# Patient Record
Sex: Female | Born: 1965
Health system: Southern US, Community
[De-identification: ages and names within clinical notes are randomized; demographics above are authoritative.]

## PROBLEM LIST (undated history)

## (undated) DIAGNOSIS — F419 Anxiety disorder, unspecified: Secondary | ICD-10-CM

## (undated) DIAGNOSIS — E559 Vitamin D deficiency, unspecified: Secondary | ICD-10-CM

## (undated) DIAGNOSIS — E785 Hyperlipidemia, unspecified: Secondary | ICD-10-CM

## (undated) HISTORY — DX: Anxiety disorder, unspecified: F41.9

## (undated) HISTORY — DX: Hyperlipidemia, unspecified: E78.5

---

## 1989-04-25 HISTORY — PX: ABDOMINAL HYSTERECTOMY: SHX81

## 1989-04-25 HISTORY — PX: RADICAL ABDOMINAL HYSTERECTOMY: SUR659

## 2002-12-16 ENCOUNTER — Emergency Department (HOSPITAL_COMMUNITY): Admission: EM | Admit: 2002-12-16 | Discharge: 2002-12-16 | Payer: Self-pay | Admitting: Emergency Medicine

## 2010-09-21 ENCOUNTER — Other Ambulatory Visit: Payer: Self-pay | Admitting: Family Medicine

## 2010-09-21 DIAGNOSIS — R928 Other abnormal and inconclusive findings on diagnostic imaging of breast: Secondary | ICD-10-CM

## 2010-09-21 DIAGNOSIS — N6459 Other signs and symptoms in breast: Secondary | ICD-10-CM

## 2010-10-06 ENCOUNTER — Encounter (HOSPITAL_COMMUNITY): Payer: Self-pay

## 2010-10-13 ENCOUNTER — Encounter (HOSPITAL_COMMUNITY): Payer: Self-pay

## 2010-10-13 ENCOUNTER — Other Ambulatory Visit (HOSPITAL_COMMUNITY): Payer: Self-pay | Admitting: Family Medicine

## 2010-10-13 ENCOUNTER — Ambulatory Visit (HOSPITAL_COMMUNITY)
Admission: RE | Admit: 2010-10-13 | Discharge: 2010-10-13 | Disposition: A | Discharge status: Home / Self Care | Payer: BC Managed Care – PPO | Source: Ambulatory Visit | Attending: Family Medicine | Admitting: Family Medicine

## 2010-10-13 DIAGNOSIS — R928 Other abnormal and inconclusive findings on diagnostic imaging of breast: Secondary | ICD-10-CM | POA: Insufficient documentation

## 2010-10-13 DIAGNOSIS — N6459 Other signs and symptoms in breast: Secondary | ICD-10-CM

## 2013-06-10 ENCOUNTER — Encounter: Payer: Self-pay | Admitting: *Deleted

## 2013-06-10 ENCOUNTER — Encounter (INDEPENDENT_AMBULATORY_CARE_PROVIDER_SITE_OTHER): Payer: Self-pay

## 2013-06-10 ENCOUNTER — Ambulatory Visit (INDEPENDENT_AMBULATORY_CARE_PROVIDER_SITE_OTHER): Payer: BC Managed Care – PPO | Admitting: Family Medicine

## 2013-06-10 ENCOUNTER — Telehealth: Payer: Self-pay | Admitting: Family Medicine

## 2013-06-10 ENCOUNTER — Encounter: Payer: Self-pay | Admitting: Family Medicine

## 2013-06-10 VITALS — BP 116/79 | HR 84 | Temp 97.2°F | Ht 64.0 in | Wt 170.6 lb

## 2013-06-10 DIAGNOSIS — J322 Chronic ethmoidal sinusitis: Secondary | ICD-10-CM

## 2013-06-10 MED ORDER — FLUCONAZOLE 150 MG PO TABS
150.0000 mg | ORAL_TABLET | Freq: Once | ORAL | Status: DC
Start: 2013-06-10 — End: 2014-05-12

## 2013-06-10 MED ORDER — AMOXICILLIN 875 MG PO TABS: 875.0000 mg | ORAL_TABLET | Freq: Two times a day (BID) | ORAL | Status: DC

## 2013-06-10 MED ORDER — ALPRAZOLAM 0.25 MG PO TABS: 0.2500 mg | ORAL_TABLET | Freq: Two times a day (BID) | ORAL | Status: DC | PRN

## 2013-06-10 NOTE — Telephone Encounter (Signed)
appt given  

## 2013-06-12 NOTE — Progress Notes (Signed)
   Subjective:    Patient ID: Lorraine BritainLisa Mcdaniel, female    DOB: 03-27-1966, 48 y.o.   MRN: [id number]  HPI  This 48 y.o. female presents for evaluation of URI sx's and sinus pressure.  Review of Systems    No chest pain, SOB, HA, dizziness, vision change, N/V, diarrhea, constipation, dysuria, urinary urgency or frequency, myalgias, arthralgias or rash.  Objective:   Physical Exam   Vital signs noted  Well developed well nourished female.  HEENT - Head atraumatic Normocephalic                Eyes - PERRLA, Conjuctiva - clear Sclera- Clear EOMI                Ears - EAC's Wnl TM's Wnl Gross Hearing WNL                Nose - Nares patent                 Throat - oropharanx wnl Respiratory - Lungs CTA bilateral Cardiac - RRR S1 and S2 without murmur GI - Abdomen soft Nontender and bowel sounds active x 4 Extremities - No edema. Neuro - Grossly intact.     Assessment & Plan:  Ethmoid sinusitis - Plan: amoxicillin (AMOXIL) 875 MG tablet Push po fluids, rest, tylenol and motrin otc prn as directed for fever, arthralgias, and myalgias.  Follow up prn if sx's continue or persist.  Deatra CanterWilliam J Lamari Beckles FNP

## 2014-05-12 ENCOUNTER — Encounter: Payer: Self-pay | Admitting: Family

## 2014-05-12 ENCOUNTER — Telehealth: Payer: Self-pay | Admitting: Family Medicine

## 2014-05-12 ENCOUNTER — Ambulatory Visit (INDEPENDENT_AMBULATORY_CARE_PROVIDER_SITE_OTHER): Payer: BLUE CROSS/BLUE SHIELD | Admitting: Family

## 2014-05-12 VITALS — BP 132/81 | HR 60 | Temp 98.0°F | Ht 64.0 in | Wt 173.6 lb

## 2014-05-12 DIAGNOSIS — R059 Cough, unspecified: Secondary | ICD-10-CM

## 2014-05-12 DIAGNOSIS — J069 Acute upper respiratory infection, unspecified: Secondary | ICD-10-CM

## 2014-05-12 DIAGNOSIS — R05 Cough: Secondary | ICD-10-CM

## 2014-05-12 MED ORDER — BENZONATATE 200 MG PO CAPS: 200.0000 mg | ORAL_CAPSULE | Freq: Three times a day (TID) | ORAL | Status: DC | PRN

## 2014-05-12 MED ORDER — ALPRAZOLAM 0.25 MG PO TABS: 0.2500 mg | ORAL_TABLET | Freq: Two times a day (BID) | ORAL | Status: DC | PRN

## 2014-05-12 MED ORDER — FLUCONAZOLE 150 MG PO TABS: 150.0000 mg | ORAL_TABLET | Freq: Once | ORAL | Status: DC

## 2014-05-12 NOTE — Progress Notes (Addendum)
Subjective:    Patient ID: Lorraine Mcdaniel, female    DOB: Nov 11, 1965, 49 y.o.   MRN: [id number]  Cough This is a new problem. The current episode started in the past 7 days. The problem has been unchanged. The problem occurs every few minutes. The cough is productive of purulent sputum and productive of sputum. Associated symptoms include headaches, myalgias, nasal congestion, postnasal drip and a sore throat. Pertinent negatives include no chills, ear congestion, ear pain, fever, rhinorrhea, shortness of breath or wheezing. The symptoms are aggravated by exercise. She has tried rest (Work NP prescribed antibiotic ) for the symptoms. The treatment provided mild relief. There is no history of asthma or COPD.   *Pt saw NP at work as was prescribed levaquin 750 mg dailyfor 10 days. Pt started on that yesterday.    Review of Systems  Constitutional: Negative.  Negative for fever and chills.  HENT: Positive for postnasal drip and sore throat. Negative for ear pain and rhinorrhea.   Eyes: Negative.   Respiratory: Positive for cough. Negative for shortness of breath and wheezing.   Cardiovascular: Negative.  Negative for palpitations.  Gastrointestinal: Negative.   Endocrine: Negative.   Genitourinary: Negative.   Musculoskeletal: Positive for myalgias.  Neurological: Positive for headaches.  Hematological: Negative.   Psychiatric/Behavioral: Negative.   All other systems reviewed and are negative.      Objective:   Physical Exam  Constitutional: She is oriented to person, place, and time. She appears well-developed and well-nourished. No distress.  HENT:  Head: Normocephalic and atraumatic.  Right Ear: External ear normal.  Left Ear: External ear normal.  Nasal passage erythemas with mild swelling Oropharynx erythemas   Eyes: Pupils are equal, round, and reactive to light.  Neck: Normal range of motion. Neck supple. No thyromegaly present.  Cardiovascular: Normal rate, regular  rhythm, normal heart sounds and intact distal pulses.   No murmur heard. Pulmonary/Chest: Effort normal and breath sounds normal. No respiratory distress. She has no wheezes.  Abdominal: Soft. Bowel sounds are normal. She exhibits no distension. There is no tenderness.  Musculoskeletal: Normal range of motion. She exhibits no edema or tenderness.  Neurological: She is alert and oriented to person, place, and time. She has normal reflexes. No cranial nerve deficit.  Skin: Skin is warm and dry.  Psychiatric: She has a normal mood and affect. Her behavior is normal. Judgment and thought content normal.  Vitals reviewed.     BP 132/81 mmHg  Pulse 60  Temp(Src) 98 F (36.7 C) (Oral)  Ht  (1.626 m)  Wt 173 lb 9.6 oz (78.744 kg)  BMI 29.78 kg/m2     Assessment & Plan:  1. Acute upper respiratory infection -- Take meds as prescribed - Use a cool mist humidifier  -Use saline nose sprays frequently -Saline irrigations of the nose can be very helpful if done frequently.  * 4X daily for 1 week*  * Use of a nettie pot can be helpful with this. Follow directions with this* -Force fluids -For any cough or congestion  Use plain Mucinex- regular strength or max strength is fine   * Children- consult with Pharmacist for dosing -For fever or aces or pains- take tylenol or ibuprofen appropriate for age and weight.  * for fevers greater than 101 orally you may alternate ibuprofen and tylenol every  3 hours. -Throat lozenges if help - benzonatate (TESSALON) 200 MG capsule; Take 1 capsule (200 mg total) by mouth 3 (three) times daily  as needed.  Dispense: 30 capsule; Refill: 1  2. Cough - benzonatate (TESSALON) 200 MG capsule; Take 1 capsule (200 mg total) by mouth 3 (three) times daily as needed.  Dispense: 30 capsule; Refill: 1  *Continue Levaquin    Lorraine Rodneyhristy Ozie Lupe, FNP

## 2014-05-12 NOTE — Patient Instructions (Addendum)
Upper Respiratory Infection, Adult An upper respiratory infection (URI) is also sometimes known as the common cold. The upper respiratory tract includes the nose, sinuses, throat, trachea, and bronchi. Bronchi are the airways leading to the lungs. Most people improve within 1 week, but symptoms can last up to 2 weeks. A residual cough may last even longer.  CAUSES Many different viruses can infect the tissues lining the upper respiratory tract. The tissues become irritated and inflamed and often become very moist. Mucus production is also common. A cold is contagious. You can easily spread the virus to others by oral contact. This includes kissing, sharing a glass, coughing, or sneezing. Touching your mouth or nose and then touching a surface, which is then touched by another person, can also spread the virus. SYMPTOMS  Symptoms typically develop 1 to 3 days after you come in contact with a cold virus. Symptoms vary from person to person. They may include:  Runny nose.  Sneezing.  Nasal congestion.  Sinus irritation.  Sore throat.  Loss of voice (laryngitis).  Cough.  Fatigue.  Muscle aches.  Loss of appetite.  Headache.  Low-grade fever. DIAGNOSIS  You might diagnose your own cold based on familiar symptoms, since most people get a cold 2 to 3 times a year. Your caregiver can confirm this based on your exam. Most importantly, your caregiver can check that your symptoms are not due to another disease such as strep throat, sinusitis, pneumonia, asthma, or epiglottitis. Blood tests, throat tests, and X-rays are not necessary to diagnose a common cold, but they may sometimes be helpful in excluding other more serious diseases. Your caregiver will decide if any further tests are required. RISKS AND COMPLICATIONS  You may be at risk for a more severe case of the common cold if you smoke cigarettes, have chronic heart disease (such as heart failure) or lung disease (such as asthma), or if  you have a weakened immune system. The very young and very old are also at risk for more serious infections. Bacterial sinusitis, middle ear infections, and bacterial pneumonia can complicate the common cold. The common cold can worsen asthma and chronic obstructive pulmonary disease (COPD). Sometimes, these complications can require emergency medical care and may be life-threatening. PREVENTION  The best way to protect against getting a cold is to practice good hygiene. Avoid oral or hand contact with people with cold symptoms. Wash your hands often if contact occurs. There is no clear evidence that vitamin C, vitamin E, echinacea, or exercise reduces the chance of developing a cold. However, it is always recommended to get plenty of rest and practice good nutrition. TREATMENT  Treatment is directed at relieving symptoms. There is no cure. Antibiotics are not effective, because the infection is caused by a virus, not by bacteria. Treatment may include:  Increased fluid intake. Sports drinks offer valuable electrolytes, sugars, and fluids.  Breathing heated mist or steam (vaporizer or shower).  Eating chicken soup or other clear broths, and maintaining good nutrition.  Getting plenty of rest.  Using gargles or lozenges for comfort.  Controlling fevers with ibuprofen or acetaminophen as directed by your caregiver.  Increasing usage of your inhaler if you have asthma. Zinc gel and zinc lozenges, taken in the first 24 hours of the common cold, can shorten the duration and lessen the severity of symptoms. Pain medicines may help with fever, muscle aches, and throat pain. A variety of non-prescription medicines are available to treat congestion and runny nose. Your caregiver   can make recommendations and may suggest nasal or lung inhalers for other symptoms.  HOME CARE INSTRUCTIONS   Only take over-the-counter or prescription medicines for pain, discomfort, or fever as directed by your  caregiver.  Use a warm mist humidifier or inhale steam from a shower to increase air moisture. This may keep secretions moist and make it easier to breathe.  Drink enough water and fluids to keep your urine clear or pale yellow.  Rest as needed.  Return to work when your temperature has returned to normal or as your caregiver advises. You may need to stay home longer to avoid infecting others. You can also use a face mask and careful hand washing to prevent spread of the virus. SEEK MEDICAL CARE IF:   After the first few days, you feel you are getting worse rather than better.  You need your caregiver's advice about medicines to control symptoms.  You develop chills, worsening shortness of breath, or brown or red sputum. These may be signs of pneumonia.  You develop yellow or brown nasal discharge or pain in the face, especially when you bend forward. These may be signs of sinusitis.  You develop a fever, swollen neck glands, pain with swallowing, or white areas in the back of your throat. These may be signs of strep throat. SEEK IMMEDIATE MEDICAL CARE IF:   You have a fever.  You develop severe or persistent headache, ear pain, sinus pain, or chest pain.  You develop wheezing, a prolonged cough, cough up blood, or have a change in your usual mucus (if you have chronic lung disease).  You develop sore muscles or a stiff neck. Document Released: 10/05/2000 Document Revised: 07/04/2011 Document Reviewed: 07/17/2013 [employer] Patient Information 2015 [employer]. This information is not intended to replace advice given to you by your health care provider. Make sure you discuss any questions you have with your health care provider.  - Take meds as prescribed - Use a cool mist humidifier  -Use saline nose sprays frequently -Saline irrigations of the nose can be very helpful if done frequently.  * 4X daily for 1 week*  * Use of a nettie pot can be helpful with this. Follow  directions with this* -Force fluids -For any cough or congestion  Use plain Mucinex- regular strength or max strength is fine   * Children- consult with Pharmacist for dosing -For fever or aces or pains- take tylenol or ibuprofen appropriate for age and weight.  * for fevers greater than 101 orally you may alternate ibuprofen and tylenol every  3 hours. -Throat lozenges if help   Christy Hawks, FNP   

## 2014-05-12 NOTE — Telephone Encounter (Signed)
Appointment given for 8:10am with stacks

## 2014-05-13 ENCOUNTER — Ambulatory Visit: Payer: Self-pay | Admitting: Family Medicine

## 2014-07-04 ENCOUNTER — Telehealth: Payer: Self-pay | Admitting: Family Medicine

## 2014-07-04 ENCOUNTER — Encounter (HOSPITAL_COMMUNITY): Payer: Self-pay | Admitting: *Deleted

## 2014-07-04 ENCOUNTER — Emergency Department (HOSPITAL_COMMUNITY): Payer: BLUE CROSS/BLUE SHIELD

## 2014-07-04 ENCOUNTER — Emergency Department (HOSPITAL_COMMUNITY)
Admission: EM | Admit: 2014-07-04 | Discharge: 2014-07-04 | Disposition: A | Discharge status: Home / Self Care | Payer: BLUE CROSS/BLUE SHIELD | Attending: Emergency Medicine | Admitting: Emergency Medicine

## 2014-07-04 DIAGNOSIS — Z720 Tobacco use: Secondary | ICD-10-CM | POA: Diagnosis not present

## 2014-07-04 DIAGNOSIS — E785 Hyperlipidemia, unspecified: Secondary | ICD-10-CM | POA: Insufficient documentation

## 2014-07-04 DIAGNOSIS — Z79899 Other long term (current) drug therapy: Secondary | ICD-10-CM | POA: Insufficient documentation

## 2014-07-04 DIAGNOSIS — R0789 Other chest pain: Secondary | ICD-10-CM | POA: Diagnosis not present

## 2014-07-04 DIAGNOSIS — R079 Chest pain, unspecified: Secondary | ICD-10-CM | POA: Diagnosis present

## 2014-07-04 DIAGNOSIS — E559 Vitamin D deficiency, unspecified: Secondary | ICD-10-CM | POA: Diagnosis not present

## 2014-07-04 DIAGNOSIS — F419 Anxiety disorder, unspecified: Secondary | ICD-10-CM | POA: Insufficient documentation

## 2014-07-04 HISTORY — DX: Vitamin D deficiency, unspecified: E55.9

## 2014-07-04 LAB — BASIC METABOLIC PANEL
ANION GAP: 6 (ref 5–15)
BUN: 9 mg/dL (ref 6–23)
CHLORIDE: 108 mmol/L (ref 96–112)
CO2: 25 mmol/L (ref 19–32)
Calcium: 9.3 mg/dL (ref 8.4–10.5)
Creatinine, Ser: 0.71 mg/dL (ref 0.50–1.10)
Glucose, Bld: 114 mg/dL — ABNORMAL HIGH (ref 70–99)
Potassium: 3.6 mmol/L (ref 3.5–5.1)
Sodium: 139 mmol/L (ref 135–145)

## 2014-07-04 LAB — CBC
HEMATOCRIT: 43.4 % (ref 36.0–46.0)
HEMOGLOBIN: 14 g/dL (ref 12.0–15.0)
MCH: 27.6 pg (ref 26.0–34.0)
MCHC: 32.3 g/dL (ref 30.0–36.0)
MCV: 85.4 fL (ref 78.0–100.0)
Platelets: 350 10*3/uL (ref 150–400)
RBC: 5.08 MIL/uL (ref 3.87–5.11)
RDW: 13.9 % (ref 11.5–15.5)
WBC: 8.6 10*3/uL (ref 4.0–10.5)

## 2014-07-04 LAB — URINALYSIS, ROUTINE W REFLEX MICROSCOPIC
BILIRUBIN URINE: NEGATIVE
Glucose, UA: NEGATIVE mg/dL
HGB URINE DIPSTICK: NEGATIVE
Ketones, ur: NEGATIVE mg/dL
Leukocytes, UA: NEGATIVE
Nitrite: NEGATIVE
Protein, ur: NEGATIVE mg/dL
Specific Gravity, Urine: <1.005 — ABNORMAL LOW (ref 1.005–1.030)
UROBILINOGEN UA: 0.2 mg/dL (ref 0.0–1.0)
pH: 5.5 (ref 5.0–8.0)

## 2014-07-04 LAB — PROTIME-INR
INR: 0.96 (ref 0.00–1.49)
PROTHROMBIN TIME: 12.9 s (ref 11.6–15.2)

## 2014-07-04 LAB — BRAIN NATRIURETIC PEPTIDE: B Natriuretic Peptide: 31 pg/mL (ref 0.0–100.0)

## 2014-07-04 LAB — TROPONIN I: Troponin I: <0.03 ng/mL (ref <0.031)

## 2014-07-04 MED ORDER — MECLIZINE HCL 25 MG PO TABS: 25.0000 mg | ORAL_TABLET | Freq: Three times a day (TID) | ORAL | Status: DC | PRN

## 2014-07-04 MED ORDER — HYDROCODONE-ACETAMINOPHEN 5-325 MG PO TABS
1.0000 | ORAL_TABLET | Freq: Once | ORAL | Status: DC
  Filled 2014-07-04: qty 1

## 2014-07-04 MED ORDER — METHOCARBAMOL 500 MG PO TABS: 500.0000 mg | ORAL_TABLET | Freq: Two times a day (BID) | ORAL | Status: DC | PRN

## 2014-07-04 NOTE — ED Notes (Signed)
Pain to both sides of the neck, pain to left axilla, radiating to left breast. Dizziness. Nausea. Swelling to ankles. States a little sob at times. Headache also. Symptoms x ~1 week. Pt also states she has woke up with pain to center chest, pounding at times.

## 2014-07-04 NOTE — Discharge Instructions (Signed)
°Emergency Department Resource Guide °1) Find a Doctor and Pay Out of Pocket °Although you won't have to find out who is covered by your insurance plan, it is a good idea to ask around and get recommendations. You will then need to call the office and see if the doctor you have chosen will accept you as a new patient and what types of options they offer for patients who are self-pay. Some doctors offer discounts or will set up payment plans for their patients who do not have insurance, but you will need to ask so you aren't surprised when you get to your appointment. ° °2) Contact Your Local Health Department °Not all health departments have doctors that can see patients for sick visits, but many do, so it is worth a call to see if yours does. If you don't know where your local health department is, you can check in your phone book. The CDC also has a tool to help you locate your state's health department, and many state websites also have listings of all of their local health departments. ° °3) Find a Walk-in Clinic °If your illness is not likely to be very severe or complicated, you may want to try a walk in clinic. These are popping up all over the country in pharmacies, drugstores, and shopping centers. They're usually staffed by nurse practitioners or physician assistants that have been trained to treat common illnesses and complaints. They're usually fairly quick and inexpensive. However, if you have serious medical issues or chronic medical problems, these are probably not your best option. ° °No Primary Care Doctor: °- Call [facility] at  832-8000 - they can help you locate a primary care doctor that  accepts your insurance, provides certain services, etc. °- Physician Referral Service- 1-800-533-3463 ° °Chronic Pain Problems: °Organization         Address  Phone   Notes  °[facility]  (336) 297-2271 Patients need to be referred by their primary care doctor.  ° °Medication  Assistance: °Organization         Address  Phone   Notes  °[place] [facility] [address] °[place], [place] [address] (336) 641-8030 --Must be a resident of [place] °-- Must have NO insurance coverage whatsoever (no Medicaid/ Medicare, etc.) °-- The pt. MUST have a primary care doctor that directs their care regularly and follows them in the community °  °[facility]  (866) 331-1348   °[employer]  (888) 892-1162   ° °Agencies that provide inexpensive medical care: °Organization         Address  Phone   Notes  °[facility]  (336) 832-8035   °[facility]    (336) 832-7272   °[facility] [address] °[place], [place] [address] (336) 832-4777   °[facility] [address], °[place] (336) 271-4999   °[facility]    (336) 373-0678   °[facility]    (336) 272-1050   °[facility] ° [address], [place] Phone:  (336) 832-4444, Fax:  (336) 832-4440 Hours of Operation:  9 am - 6 pm, M-F.  Also accepts Medicaid/Medicare and self-pay.  °[facility] ° [address], [place] Phone: (336) [phone], Fax: (336) [phone]. Hours of Operation:  8:30 am - 5:30 pm, M-F.  Also accepts Medicaid and self-pay.  °[facility] [place] [address], [place] Phone: (336) 878-6027   °[facility] [address], [place], [place] (336)723-1848, Ext. 123 Mondays & Thursdays: 7-9 AM.  First 15 patients are seen on a first come, first serve basis. °  ° °Medicaid-accepting [place] Providers: ° °Organization         Address  Phone   Notes  °[facility] [address], [place] (336) 641-2100 Also accepts self-pay patients.  °[facility] [address], [place] ° (336) 856-9996   °[facility] [address], [place]  (336) 288-8857   °[facility] [address] [place] [address], [place] (336) 299-7000   °Veita Bland [address], [place]  ° (336) 373-1557 Only accepts [place] Access Medicaid patients after they have their name applied to their card.  ° °Self-Pay (no insurance) in [place]: ° °Organization         Address  Phone   Notes  °Sickle Cell Patients, [facility] [address], [place] (336) 832-1970   °[facility] [address], [place] (336) 832-4400   °[facility] ° [address], [place] (336) 992-4800   °[facility]/Dr. Osei-Bonsu ° [address] [place] [address], [place] or [address], [place] (336) [phone] Phone number for both [place] and [place] locations is the same.  °[facility] [address], [place] (336) 299-0000   °[facility] [address] [place] [address], [place] or [address] (336) 852-7530 °(336) 878-2260   °[facility] [address], [place] (336) 350-1642, phone; (336) 294-5005, fax Sees patients 1st and 3rd Saturday of every month.  Must not qualify for public or private insurance (i.e. Medicaid, Medicare, [place] Health Choice, Veterans' Benefits) • Household income should be no more than 200% of the poverty level •The clinic cannot treat you if you are pregnant or think you are pregnant • Sexually transmitted diseases are not treated at the clinic.  ° ° °Dental Care: °Organization         Address  Phone  Notes  °[place] [facility] [address], [place] (336) 641-6152 Accepts children up to age 21 who are enrolled in Medicaid or [place] Health Choice; pregnant women with a Medicaid card; and children who have applied for Medicaid or [place] Health Choice, but were declined, whose parents can pay a reduced fee at time of service.  °[place]  [facility] [place]  [address], [place] (336) 641-7733 Accepts children up to age 21 who are enrolled in Medicaid or [place] Health Choice; pregnant women with a Medicaid card; and children who have applied for Medicaid or [place] Health Choice, but were declined, whose parents can pay a reduced fee at time of service.  °[facility] ° [address], [place] (336) 641-4533 Patients are seen by appointment only. Walk-ins are not accepted. [facility] will see patients 18 years of age and older. °Monday - Tuesday (8am-5pm) °Most Wednesdays (8:30-5pm) °$30 per visit, cash only  °[facility] ° [address], [place] (336) 641-4533 Patients are seen by appointment only. Walk-ins are not accepted. [facility] will see patients 18 years of age and older. °One   Wednesday Evening (Monthly: Volunteer Based).  $30 per visit, cash only  °[facility]  (919) 537-3737 for adults; Children under age 4, call Graduate Pediatric Dentistry at ([phone]) [phone]. Children aged 4-14, please call ([phone]) [phone] to request a pediatric application. ° Dental services are provided in all areas of dental care including fillings, crowns and bridges, complete and partial dentures, implants, gum treatment, root canals, and extractions. Preventive care is also provided. Treatment is provided to both adults and children. °Patients are selected via a lottery and there is often a waiting list. °  °[facility] [address], °[place] ° (336) 763-8833 [email or web address] °  °[facility] [address], [place], [place] (336)723-1848, Ext. 123 Second and Fourth Thursday of each month, opens at 6:30 AM; Clinic ends at 9 AM.  Patients are seen on a first-come first-served basis, and a limited number are seen during each clinic.  ° °[facility] ° [address], [place], [place] (336) 723-7904    Eligibility Requirements °You must have lived in [place], [place], or [place] counties for at least the last three months. °  You cannot be eligible for state or federal sponsored healthcare insurance, including Veterans Administration, Medicaid, or Medicare. °  You generally cannot be eligible for healthcare insurance through your employer.  °  How to apply: °Eligibility screenings are held every Tuesday and Wednesday afternoon from 1:00 pm until 4:00 pm. You do not need an appointment for the interview!  °[facility] [address], [place], [place] 336-631-2330   °[facility]  336-342-8273   °[facility]  336-703-3100   °[facility]  336-570-6415   ° °Behavioral Health Resources in the Community: °Intensive Outpatient Programs °Organization         Address  Phone  Notes  °[facility] [address], [place], [place] 336-878-6098   °[facility] [address], [place], [place] 336-832-9800   °[facility] [address], [place], [place] ° 336-882-2125   °[facility] [address],  °[place], [place] 1-800-853-5163 or 336-641-4981   °Substance Abuse Resources °Organization         Address  Phone  Notes  °[facility]  336-882-2125   °[facility]  336-784-9470   °[facility]  336-285-9073   °[facility]  336-845-3988   °Residential & Outpatient Substance Abuse Program  1-800-659-3381   °Psychological Services °Organization         Address  Phone  Notes  °[facility]  336- 832-9600   °[facility]  336- 378-7881   °[facility] [address], [place] 1-800-853-5163 or 336-641-4981   ° °Mobile Crisis Teams °Organization         Address  Phone  Notes  °[facility], Mobile Crisis Care Unit  1-877-626-1772   °Assertive °[facility] ° [address]  [place], [place] 336-834-9664   °Sharon DeEsch [address] °[place] 336-554-5454   ° °Self-Help/Support Groups °Organization         Address  Phone             Notes  °[facility] of  - variety of support groups  336- 373-1402 Call for more information  °Narcotics Anonymous (NA), [facility] [address], °[place]  2 meetings at this location  ° °  Residential Treatment Programs Organization         Address  Phone  Notes  [facility] [address],    [place]  [phone]) [phone]   [facility]  [address], [place] [phone], [place], [place] [phone]   [facility] [address], [place] [phone] Admissions: 8am-3pm M-F  [facility] [address]    [place], [place] [phone]   [facility] [address], [place], [place] [phone]   [facility] [address]  [place], [place] [phone]   [facility] Intensive Outpatient [address], [place], [place] [phone]   [facility] ([facility] [address]  [place], [place] [phone] or [phone]   [facility] ([facility] [address] [place], [place] [phone]) [phone] Accepts Medicaid  [facility] [address]  [place] [phone] Substance Abuse/Addiction Treatment   [facility] Resources Organization         Address  Phone  Notes  [facility]  (205)[phone](888) (772)[phone]   Angie FavaJulie Brannon, PhD [address], [address] [place], [place]   [phone](336) [phone] or [phone](336) [phone]   [facility]   [address] [place], [place] [phone](336) [phone]   [facility] [address], [place], [place] [phone](336) [phone] Insurance/Medicaid/sponsorship through [facility] [address]                                    [place], [place] [phone]) [phone] Therapy/tele-psych/case    [facility] [address]   [place], [place] [phone]) [phone]    Dr. Lolly MustacheArfeen  [phone]) [phone]   [facility] [place]  [employer] [facility] 1) [address],  2) [address], [place] 3)  [address], [place] [phone](336) [phone](336) ([phone]  (708) [phone](336) [phone]   [facility] Child Abuse Hotline 209-482-4062(336) (252)450-4761 or (229) [phone](336) ([phone] (After Hours)      Take the prescriptions as directed. Take over the counter tylenol and ibuprofen, as directed on packaging, as needed for discomfort.  Apply moist heat or ice to the area(s) of discomfort, for 15 minutes at a time, several times per day for the next few days.  Do not fall asleep on a heating or ice pack.  Take over the counter decongestant (such as sudafed), as directed on packaging, for the next week.  Use over the counter normal saline nasal spray, as instructed in the Emergency Department, several times per day for the next 2 weeks.  Call your regular medical doctor today to schedule a follow up appointment in the next 3 days.  Return to the Emergency Department immediately if worsening.

## 2014-07-04 NOTE — ED Provider Notes (Signed)
CSN: [id number]     Arrival date & time 07/04/14  4098 History   First MD Initiated Contact with Patient 07/04/14 [phone]     Chief Complaint  Patient presents with  . Chest Pain      HPI Pt was seen at 0955.  Per pt, c/o gradual onset and persistence of constant left sided chest wall "pain" for the past week. Pt states the pain begins "under my armpit," and radiates into the front of her chest. Pain worsens with palpation of the area and body position changes. Describes the CP as "dull." Pt also c/o frontal headache, sinus congestion, left sided neck "pain," and "swelling to ankles." States she was "dizzy" several days ago; described as "everything was moving around."  Pt states she has "been under a lot of stress" for the past week, and before her symptoms began. Pt's husband states they "got a new mattress," also before pt's symptoms began. Pt denies palpitations, no SOB/cough, no abd pain, no N/V/D, no back pain, no focal motor weakness, no tingling/numbness in extremities, no syncope/near syncope, no fevers, no rash.    Past Medical History  Diagnosis Date  . Anxiety   . Hyperlipidemia   . Vitamin D deficiency    Past Surgical History  Procedure Laterality Date  . Abdominal hysterectomy  1991   Family History  Problem Relation Age of Onset  . Heart disease Mother   . Arthritis Mother   . Stroke Father   . Hypertension Father   . Heart disease Father    History  Substance Use Topics  . Smoking status: Current Every Day Smoker -- 0.75 packs/day    Types: Cigarettes    Start date: 11/19/1970  . Smokeless tobacco: Not on file  . Alcohol Use: No    Review of Systems ROS: Statement: All systems negative except as marked or noted in the HPI; Constitutional: Negative for fever and chills. ; ; Eyes: Negative for eye pain, redness and discharge. ; ; ENMT: Negative for ear pain, hoarseness, sore throat. +nasal congestion, sinus pressure. ; ; Cardiovascular: Negative for palpitations,  diaphoresis, dyspnea and +peripheral edema. ; ; Respiratory: Negative for cough, wheezing and stridor. ; ; Gastrointestinal: Negative for nausea, vomiting, diarrhea, abdominal pain, blood in stool, hematemesis, jaundice and rectal bleeding. . ; ; Genitourinary: Negative for dysuria, flank pain and hematuria. ; ; Musculoskeletal: +left chest wall pain, left side neck pain. Negative for back pain. Negative for swelling and trauma.; ; Skin: Negative for pruritus, rash, abrasions, blisters, bruising and skin lesion.; ; Neuro: Negative for lightheadedness and neck stiffness. Negative for weakness, altered level of consciousness , altered mental status, extremity weakness, paresthesias, involuntary movement, seizure and syncope.; Psych:  +anxiety. No SI, no SA, no HI, no hallucinations.     Allergies  Prednisone; Percocet; and Sulfa antibiotics  Home Medications   Prior to Admission medications   Medication Sig Start Date End Date Taking? Authorizing Provider  ALPRAZolam (XANAX) 0.25 MG tablet Take 1 tablet (0.25 mg total) by mouth 2 (two) times daily as needed for anxiety. 05/12/14   Junie Spencer, FNP  atorvastatin (LIPITOR) 10 MG tablet Take 10 mg by mouth daily.    Historical Provider, MD  benzonatate (TESSALON) 200 MG capsule Take 1 capsule (200 mg total) by mouth 3 (three) times daily as needed. 05/12/14   Junie Spencer, FNP  fluconazole (DIFLUCAN) 150 MG tablet Take 1 tablet (150 mg total) by mouth once. 05/12/14   Junie Spencer,  FNP   BP 153/76 mmHg  Pulse 74  Temp(Src) 97.9 F (36.6 C) (Oral)  Resp 16  Ht  (1.626 m)  Wt 175 lb (79.379 kg)  BMI 30.02 kg/m2  SpO2 97% Physical Exam  1000; Physical examination:  Nursing notes reviewed; Vital signs and O2 SAT reviewed;  Constitutional: Well developed, Well nourished, Well hydrated, In no acute distress; Head:  Normocephalic, atraumatic; Eyes: EOMI, PERRL, No scleral icterus; ENMT: TM's clear bilat. +edemetous nasal turbinates bilat  with clear rhinorrhea. +tender to percuss frontal sinus. Mouth and pharynx normal, Mucous membranes moist; Neck: Supple, Full range of motion, No lymphadenopathy. No meningeal signs.; Cardiovascular: Regular rate and rhythm, No murmur, rub, or gallop; Respiratory: Breath sounds clear & equal bilaterally, No rales, rhonchi, wheezes.  Speaking full sentences with ease, Normal respiratory effort/excursion; Chest: +left upper and mid anterior-lateral chest wall areas tender to palp. No rash, no soft tissue crepitus, no deformity. Movement normal; Abdomen: Soft, Nontender, Nondistended, Normal bowel sounds; Genitourinary: No CVA tenderness; Spine:  No midline CS, TS, LS tenderness. +TTP left hypertonic trapezius muscle. No rash.;; Extremities: Pulses normal, No tenderness, No edema, No calf edema or asymmetry.; Neuro: AA&Ox3, Major CN grossly intact. No facial droop. Speech clear. No gross focal motor or sensory deficits in extremities. Climbs on and off stretcher easily by herself. Gait steady.; Skin: Color normal, Warm, Dry.; Psych:  Anxious.    ED Course  Procedures     EKG Interpretation None      MDM  MDM Reviewed: previous chart, nursing note and vitals Reviewed previous: labs Interpretation: labs, ECG and x-ray       Date: 07/04/2014  Rate: 81  Rhythm: normal sinus rhythm  QRS Axis: normal  Intervals: normal  ST/T Wave abnormalities: normal  Conduction Disutrbances:none  Narrative Interpretation:   Old EKG Reviewed: none available.  Results for orders placed or performed during the hospital encounter of 07/04/14  Troponin I (MHP)  Result Value Ref Range   Troponin I <0.03 <0.031 ng/mL  Protime-INR (if pt is taking Coumadin)  Result Value Ref Range   Prothrombin Time 12.9 11.6 - 15.2 seconds   INR 0.96 0.00 - 1.49  BNP (order ONLY if patient complains of dyspnea/SOB AND you have documented it for THIS visit)  Result Value Ref Range   B Natriuretic Peptide 31.0 0.0 - 100.0  pg/mL  Basic metabolic panel  Result Value Ref Range   Sodium 139 135 - 145 mmol/L   Potassium 3.6 3.5 - 5.1 mmol/L   Chloride 108 96 - 112 mmol/L   CO2 25 19 - 32 mmol/L   Glucose, Bld 114 (H) 70 - 99 mg/dL   BUN 9 6 - 23 mg/dL   Creatinine, Ser 1.61 0.50 - 1.10 mg/dL   Calcium 9.3 8.4 - 09.6 mg/dL   GFR calc non Af Amer >90 >90 mL/min   GFR calc Af Amer >90 >90 mL/min   Anion gap 6 5 - 15  CBC  Result Value Ref Range   WBC 8.6 4.0 - 10.5 K/uL   RBC 5.08 3.87 - 5.11 MIL/uL   Hemoglobin 14.0 12.0 - 15.0 g/dL   HCT 04.5 40.9 - 81.1 %   MCV 85.4 78.0 - 100.0 fL   MCH 27.6 26.0 - 34.0 pg   MCHC 32.3 30.0 - 36.0 g/dL   RDW 91.4 78.2 - 95.6 %   Platelets 350 150 - 400 K/uL   Dg Chest 2 View 07/04/2014   CLINICAL DATA:  Left arm and chest pain.  Tachycardia.  EXAM: CHEST  2 VIEW  COMPARISON:  None.  FINDINGS: Mild hyperinflation. Numerous leads and wires project over the chest. Midline trachea. Normal heart size and mediastinal contours. No pleural effusion or pneumothorax. mild biapical pleural thickening. Clear lungs.  IMPRESSION: Hyperinflation, without acute disease.   Electronically Signed   By: Jeronimo GreavesKyle  Talbot M.D.   On: 07/04/2014 10:40    1315:  Pt gave urine sample, but unable to be found in lab. Pt does not want another collected, and she denies any urinary symptoms at this time. Neuro exam remains intact, VSS. Pt has tol PO well while in the ED without N/V. Pt has been ambulatory with steady gait, easy resps, NAD. Pt refused offered medications while in the ED. Pt states she "doesn't want anything strong or anything that's going to make me groggy or feel funny." Long d/w pt regarding medications to tx her symptoms; she has decided that she will take rx for "a low dose of a muscle relaxer because I just need something to relax" as well as "something if I get dizzy again."  Pt wants to go home now. She is also requesting a work note. Doubt PE as cause for symptoms with low risk [place].   Doubt ACS as cause for symptoms with normal troponin and EKG after 1 week of constant symptoms. Dx and testing d/w pt and family.  Questions answered.  Verb understanding, agreeable to d/c home with outpt f/u.    Samuel JesterKathleen Jahnavi Muratore, DO 07/07/14 1157

## 2014-07-04 NOTE — Telephone Encounter (Signed)
Call given to triage °

## 2014-07-04 NOTE — Telephone Encounter (Signed)
Patient called in and stated that she was having chest pain and that she was weak and fatigued. Patient was advised to go to ER for evaluation.

## 2014-07-04 NOTE — ED Notes (Signed)
Pt in restroom 

## 2014-07-10 ENCOUNTER — Encounter: Payer: Self-pay | Admitting: Family

## 2014-07-10 ENCOUNTER — Ambulatory Visit (INDEPENDENT_AMBULATORY_CARE_PROVIDER_SITE_OTHER): Payer: BLUE CROSS/BLUE SHIELD | Admitting: Family

## 2014-07-10 VITALS — BP 137/92 | HR 85 | Temp 98.0°F | Ht 64.0 in | Wt 173.4 lb

## 2014-07-10 DIAGNOSIS — F411 Generalized anxiety disorder: Secondary | ICD-10-CM | POA: Insufficient documentation

## 2014-07-10 DIAGNOSIS — Z09 Encounter for follow-up examination after completed treatment for conditions other than malignant neoplasm: Secondary | ICD-10-CM

## 2014-07-10 NOTE — Progress Notes (Signed)
   Subjective:    Patient ID: Lorraine BritainLisa Mcdaniel, female    DOB: 14-May-1965, 49 y.o.   MRN: [id number]  HPI Pt presents to the office today for a hospital follow up. Pt went to the ED on 07/04/14 for chest wall pain, dizziness, and swelling in BLE. Pt's EKG and cardiac enzymes were negative. Pt states she was givens a muscle relaxer and antivert. Pt denies any SOB, or edema at this time. Pt states she has not taken either medications.   *Reviewed pt's ED notes  Review of Systems  Constitutional: Negative.   HENT: Negative.   Eyes: Negative.   Respiratory: Negative.  Negative for shortness of breath.   Cardiovascular: Negative.  Negative for palpitations.  Gastrointestinal: Negative.   Endocrine: Negative.   Genitourinary: Negative.   Musculoskeletal: Negative.   Neurological: Negative.  Negative for headaches.  Hematological: Negative.   Psychiatric/Behavioral: Negative.   All other systems reviewed and are negative.      Objective:   Physical Exam  Constitutional: She is oriented to person, place, and time. She appears well-developed and well-nourished. No distress.  HENT:  Head: Normocephalic and atraumatic.  Right Ear: External ear normal.  Left Ear: External ear normal.  Nose: Nose normal.  Mouth/Throat: Oropharynx is clear and moist.  Eyes: Pupils are equal, round, and reactive to light.  Neck: Normal range of motion. Neck supple. No thyromegaly present.  Cardiovascular: Normal rate, regular rhythm, normal heart sounds and intact distal pulses.   No murmur heard. Pulmonary/Chest: Effort normal and breath sounds normal. No respiratory distress. She has no wheezes.  Abdominal: Soft. Bowel sounds are normal. She exhibits no distension. There is no tenderness.  Musculoskeletal: Normal range of motion. She exhibits no edema or tenderness.  Neurological: She is alert and oriented to person, place, and time. She has normal reflexes. No cranial nerve deficit.  Skin: Skin is warm and  dry.  Psychiatric: She has a normal mood and affect. Her behavior is normal. Judgment and thought content normal.  Vitals reviewed.   BP 137/92 mmHg  Pulse 85  Temp(Src) 98 F (36.7 C) (Oral)  Ht 5\' 4"  (1.626 m)  Wt 173 lb 6.4 oz (78.654 kg)  BMI 29.75 kg/m2       Assessment & Plan:  1. Hospital discharge follow-up  2. GAD (generalized anxiety disorder)  Stress management discussed Pt does not want to be on SSI at this time-Does not want to be on any more medications at this time Pt encouraged to make a annual physical appointment RTO prn   Jannifer Rodneyhristy Shaketa Serafin, FNP

## 2014-07-10 NOTE — Patient Instructions (Addendum)
Generalized Anxiety Disorder Generalized anxiety disorder (GAD) is a mental disorder. It interferes with life functions, including relationships, work, and school. GAD is different from normal anxiety, which everyone experiences at some point in their lives in response to specific life events and activities. Normal anxiety actually helps [place] prepare for and get through these life events and activities. Normal anxiety goes away after the event or activity is over.  GAD causes anxiety that is not necessarily related to specific events or activities. It also causes excess anxiety in proportion to specific events or activities. The anxiety associated with GAD is also difficult to control. GAD can vary from mild to severe. People with severe GAD can have intense waves of anxiety with physical symptoms (panic attacks).  SYMPTOMS The anxiety and worry associated with GAD are difficult to control. This anxiety and worry are related to many life events and activities and also occur more days than not for 6 months or longer. People with GAD also have three or more of the following symptoms (one or more in children):  Restlessness.   Fatigue.  Difficulty concentrating.   Irritability.  Muscle tension.  Difficulty sleeping or unsatisfying sleep. DIAGNOSIS GAD is diagnosed through an assessment by your health care provider. Your health care provider will ask you questions aboutyour mood,physical symptoms, and events in your life. Your health care provider may ask you about your medical history and use of alcohol or drugs, including prescription medicines. Your health care provider may also do a physical exam and blood tests. Certain medical conditions and the use of certain substances can cause symptoms similar to those associated with GAD. Your health care provider may refer you to a mental health specialist for further evaluation. TREATMENT The following therapies are usually used to treat GAD:    Medication. Antidepressant medication usually is prescribed for long-term daily control. Antianxiety medicines may be added in severe cases, especially when panic attacks occur.   Talk therapy (psychotherapy). Certain types of talk therapy can be helpful in treating GAD by providing support, education, and guidance. A form of talk therapy called cognitive behavioral therapy can teach you healthy ways to think about and react to daily life events and activities.  Stress managementtechniques. These include yoga, meditation, and exercise and can be very helpful when they are practiced regularly. A mental health specialist can help determine which treatment is best for you. Some people see improvement with one therapy. However, other people require a combination of therapies. Document Released: 08/06/2012 Document Revised: 08/26/2013 Document Reviewed: 08/06/2012 [facility] Patient Information 2015 [place], [place]. This information is not intended to replace advice given to you by your health care provider. Make sure you discuss any questions you have with your health care provider.  Health Maintenance Adopting a healthy lifestyle and getting preventive care can go a long way to promote health and wellness. Talk with your health care provider about what schedule of regular examinations is right for you. This is a good chance for you to check in with your provider about disease prevention and staying healthy. In between checkups, there are plenty of things you can do on your own. Experts have done a lot of research about which lifestyle changes and preventive measures are most likely to keep you healthy. Ask your health care provider for more information. WEIGHT AND DIET  Eat a healthy diet  Be sure to include plenty of vegetables, fruits, low-fat dairy products, and lean protein.  Do not eat a lot of foods  high in solid fats, added sugars, or salt.  Get regular exercise. This is one of the most  important things you can do for your health.  Most adults should exercise for at least 150 minutes each week. The exercise should increase your heart rate and make you sweat (moderate-intensity exercise).  Most adults should also do strengthening exercises at least twice a week. This is in addition to the moderate-intensity exercise.  Maintain a healthy weight  Body mass index (BMI) is a measurement that can be used to identify possible weight problems. It estimates body fat based on height and weight. Your health care provider can help determine your BMI and help you achieve or maintain a healthy weight.  For females 36 years of age and older:   A BMI below 18.5 is considered underweight.  A BMI of 18.5 to 24.9 is normal.  A BMI of 25 to 29.9 is considered overweight.  A BMI of 30 and above is considered obese.  Watch levels of cholesterol and blood lipids  You should start having your blood tested for lipids and cholesterol at 51 years of age, then have this test every 5 years.  You may need to have your cholesterol levels checked more often if:  Your lipid or cholesterol levels are high.  You are older than 50 years of age.  You are at high risk for heart disease.  CANCER SCREENING   Lung Cancer  Lung cancer screening is recommended for adults 2-73 years old who are at high risk for lung cancer because of a history of smoking.  A yearly low-dose CT scan of the lungs is recommended for people who:  Currently smoke.  Have quit within the past 15 years.  Have at least a 30-pack-year history of smoking. A pack year is smoking an average of one pack of cigarettes a day for 1 year.  Yearly screening should continue until it has been 15 years since you quit.  Yearly screening should stop if you develop a health problem that would prevent you from having lung cancer treatment.  Breast Cancer  Practice breast self-awareness. This means understanding how your breasts  normally appear and feel.  It also means doing regular breast self-exams. Let your health care provider know about any changes, no matter how small.  If you are in your 20s or 30s, you should have a clinical breast exam (CBE) by a health care provider every 1-3 years as part of a regular health exam.  If you are 68 or older, have a CBE every year. Also consider having a breast X-ray (mammogram) every year.  If you have a family history of breast cancer, talk to your health care provider about genetic screening.  If you are at high risk for breast cancer, talk to your health care provider about having an MRI and a mammogram every year.  Breast cancer gene (BRCA) assessment is recommended for women who have family members with BRCA-related cancers. BRCA-related cancers include:  Breast.  Ovarian.  Tubal.  Peritoneal cancers.  Results of the assessment will determine the need for genetic counseling and BRCA1 and BRCA2 testing. Cervical Cancer Routine pelvic examinations to screen for cervical cancer are no longer recommended for nonpregnant women who are considered low risk for cancer of the pelvic organs (ovaries, uterus, and vagina) and who do not have symptoms. A pelvic examination may be necessary if you have symptoms including those associated with pelvic infections. Ask your health care provider if a  screening pelvic exam is right for you.   The Pap test is the screening test for cervical cancer for women who are considered at risk.  If you had a hysterectomy for a problem that was not cancer or a condition that could lead to cancer, then you no longer need Pap tests.  If you are older than 65 years, and you have had normal Pap tests for the past 10 years, you no longer need to have Pap tests.  If you have had past treatment for cervical cancer or a condition that could lead to cancer, you need Pap tests and screening for cancer for at least 20 years after your treatment.  If you  no longer get a Pap test, assess your risk factors if they change (such as having a new sexual partner). This can affect whether you should start being screened again.  Some women have medical problems that increase their chance of getting cervical cancer. If this is the case for you, your health care provider may recommend more frequent screening and Pap tests.  The human papillomavirus (HPV) test is another test that may be used for cervical cancer screening. The HPV test looks for the virus that can cause cell changes in the cervix. The cells collected during the Pap test can be tested for HPV.  The HPV test can be used to screen women 47 years of age and older. Getting tested for HPV can extend the interval between normal Pap tests from three to five years.  An HPV test also should be used to screen women of any age who have unclear Pap test results.  After 50 years of age, women should have HPV testing as often as Pap tests.  Colorectal Cancer  This type of cancer can be detected and often prevented.  Routine colorectal cancer screening usually begins at 50 years of age and continues through 50 years of age.  Your health care provider may recommend screening at an earlier age if you have risk factors for colon cancer.  Your health care provider may also recommend using home test kits to check for hidden blood in the stool.  A small camera at the end of a tube can be used to examine your colon directly (sigmoidoscopy or colonoscopy). This is done to check for the earliest forms of colorectal cancer.  Routine screening usually begins at age 23.  Direct examination of the colon should be repeated every 5-10 years through 50 years of age. However, you may need to be screened more often if early forms of precancerous polyps or small growths are found. Skin Cancer  Check your skin from head to toe regularly.  Tell your health care provider about any new moles or changes in moles,  especially if there is a change in a mole's shape or color.  Also tell your health care provider if you have a mole that is larger than the size of a pencil eraser.  Always use sunscreen. Apply sunscreen liberally and repeatedly throughout the day.  Protect yourself by wearing long sleeves, pants, a wide-brimmed hat, and sunglasses whenever you are outside. HEART DISEASE, DIABETES, AND HIGH BLOOD PRESSURE   Have your blood pressure checked at least every 1-2 years. High blood pressure causes heart disease and increases the risk of stroke.  If you are between 37 years and 72 years old, ask your health care provider if you should take aspirin to prevent strokes.  Have regular diabetes screenings. This involves taking a  blood sample to check your fasting blood sugar level.  If you are at a normal weight and have a low risk for diabetes, have this test once every three years after 50 years of age.  If you are overweight and have a high risk for diabetes, consider being tested at a younger age or more often. PREVENTING INFECTION  Hepatitis B  If you have a higher risk for hepatitis B, you should be screened for this virus. You are considered at high risk for hepatitis B if:  You were born in a country where hepatitis B is common. Ask your health care provider which countries are considered high risk.  Your parents were born in a high-risk country, and you have not been immunized against hepatitis B (hepatitis B vaccine).  You have HIV or AIDS.  You use needles to inject street drugs.  You live with someone who has hepatitis B.  You have had sex with someone who has hepatitis B.  You get hemodialysis treatment.  You take certain medicines for conditions, including cancer, organ transplantation, and autoimmune conditions. Hepatitis C  Blood testing is recommended for:  Everyone born from [birth year] through 1966.  Anyone with known risk factors for hepatitis C. Sexually transmitted  infections (STIs)  You should be screened for sexually transmitted infections (STIs) including gonorrhea and chlamydia if:  You are sexually active and are younger than 50 years of age.  You are older than 50 years of age and your health care provider tells you that you are at risk for this type of infection.  Your sexual activity has changed since you were last screened and you are at an increased risk for chlamydia or gonorrhea. Ask your health care provider if you are at risk.  If you do not have HIV, but are at risk, it may be recommended that you take a prescription medicine daily to prevent HIV infection. This is called pre-exposure prophylaxis (PrEP). You are considered at risk if:  You are sexually active and do not regularly use condoms or know the HIV status of your partner(s).  You take drugs by injection.  You are sexually active with a partner who has HIV. Talk with your health care provider about whether you are at high risk of being infected with HIV. If you choose to begin PrEP, you should first be tested for HIV. You should then be tested every 3 months for as long as you are taking PrEP.  PREGNANCY   If you are premenopausal and you may become pregnant, ask your health care provider about preconception counseling.  If you may become pregnant, take 400 to 800 micrograms (mcg) of folic acid every day.  If you want to prevent pregnancy, talk to your health care provider about birth control (contraception). OSTEOPOROSIS AND MENOPAUSE   Osteoporosis is a disease in which the bones lose minerals and strength with aging. This can result in serious bone fractures. Your risk for osteoporosis can be identified using a bone density scan.  If you are 69 years of age or older, or if you are at risk for osteoporosis and fractures, ask your health care provider if you should be screened.  Ask your health care provider whether you should take a calcium or vitamin D supplement to  lower your risk for osteoporosis.  Menopause may have certain physical symptoms and risks.  Hormone replacement therapy may reduce some of these symptoms and risks. Talk to your health care provider about whether hormone replacement  therapy is right for you.  HOME CARE INSTRUCTIONS   Schedule regular health, dental, and eye exams.  Stay current with your immunizations.   Do not use any tobacco products including cigarettes, chewing tobacco, or electronic cigarettes.  If you are pregnant, do not drink alcohol.  If you are breastfeeding, limit how much and how often you drink alcohol.  Limit alcohol intake to no more than 1 drink per day for nonpregnant women. One drink equals 12 ounces of beer, 5 ounces of wine, or 1 ounces of hard liquor.  Do not use street drugs.  Do not share needles.  Ask your health care provider for help if you need support or information about quitting drugs.  Tell your health care provider if you often feel depressed.  Tell your health care provider if you have ever been abused or do not feel safe at home. Document Released: 10/25/2010 Document Revised: 08/26/2013 Document Reviewed: 03/13/2013 [facility] Patient Information 2015 [place], [place]. This information is not intended to replace advice given to you by your health care provider. Make sure you discuss any questions you have with your health care provider. Escitalopram tablets What is this medicine? ESCITALOPRAM (es sye TAL oh pram) is used to treat depression and certain types of anxiety. This medicine may be used for other purposes; ask your health care provider or pharmacist if you have questions. COMMON BRAND NAME(S): Lexapro What should I tell my health care provider before I take this medicine? They need to know if you have any of these conditions: -bipolar disorder or a family history of bipolar disorder -diabetes -glaucoma -heart disease -kidney or liver disease -receiving  electroconvulsive therapy -seizures (convulsions) -suicidal thoughts, plans, or attempt by you or a family member -an unusual or allergic reaction to escitalopram, the related drug citalopram, other medicines, foods, dyes, or preservatives -pregnant or trying to become pregnant -breast-feeding How should I use this medicine? Take this medicine by mouth with a glass of water. Follow the directions on the prescription label. You can take it with or without food. If it upsets your stomach, take it with food. Take your medicine at regular intervals. Do not take it more often than directed. Do not stop taking this medicine suddenly except upon the advice of your doctor. Stopping this medicine too quickly may cause serious side effects or your condition may worsen. A special MedGuide will be given to you by the pharmacist with each prescription and refill. Be sure to read this information carefully each time. Talk to your pediatrician regarding the use of this medicine in children. Special care may be needed. Overdosage: If you think you have taken too much of this medicine contact a poison control center or emergency room at once. NOTE: This medicine is only for you. Do not share this medicine with others. What if I miss a dose? If you miss a dose, take it as soon as you can. If it is almost time for your next dose, take only that dose. Do not take double or extra doses. What may interact with this medicine? Do not take this medicine with any of the following medications: -certain medicines for fungal infections like fluconazole, itraconazole, ketoconazole, posaconazole, voriconazole -cisapride -citalopram -dofetilide -dronedarone -linezolid -MAOIs like Carbex, Eldepryl, Marplan, Nardil, and Parnate -methylene blue (injected into a vein) -pimozide -thioridazine -ziprasidone This medicine may also interact with the following medications: -alcohol -aspirin and aspirin-like  medicines -carbamazepine -certain medicines for depression, anxiety, or psychotic disturbances -certain medicines for migraine  headache like almotriptan, eletriptan, frovatriptan, naratriptan, rizatriptan, sumatriptan, zolmitriptan -certain medicines for sleep -certain medicines that treat or prevent blood clots like warfarin, enoxaparin, dalteparin -cimetidine -diuretics -fentanyl -furazolidone -isoniazid -lithium -metoprolol -NSAIDs, medicines for pain and inflammation, like ibuprofen or naproxen -other medicines that prolong the QT interval (cause an abnormal heart rhythm) -procarbazine -rasagiline -supplements like St. John's wort, kava kava, valerian -tramadol -tryptophan This list may not describe all possible interactions. Give your health care provider a list of all the medicines, herbs, non-prescription drugs, or dietary supplements you use. Also tell them if you smoke, drink alcohol, or use illegal drugs. Some items may interact with your medicine. What should I watch for while using this medicine? Tell your doctor if your symptoms do not get better or if they get worse. Visit your doctor or health care professional for regular checks on your progress. Because it may take several weeks to see the full effects of this medicine, it is important to continue your treatment as prescribed by your doctor. Patients and their families should watch out for new or worsening thoughts of suicide or depression. Also watch out for sudden changes in feelings such as feeling anxious, agitated, panicky, irritable, hostile, aggressive, impulsive, severely restless, overly excited and hyperactive, or not being able to sleep. If this happens, especially at the beginning of treatment or after a change in dose, call your health care professional. Dennis Bast may get drowsy or dizzy. Do not drive, use machinery, or do anything that needs mental alertness until you know how this medicine affects you. Do not stand  or sit up quickly, especially if you are an older patient. This reduces the risk of dizzy or fainting spells. Alcohol may interfere with the effect of this medicine. Avoid alcoholic drinks. Your mouth may get dry. Chewing sugarless gum or sucking hard candy, and drinking plenty of water may help. Contact your doctor if the problem does not go away or is severe. What side effects may I notice from receiving this medicine? Side effects that you should report to your doctor or health care professional as soon as possible: -allergic reactions like skin rash, itching or hives, swelling of the face, lips, or tongue -confusion -feeling faint or lightheaded, falls -fast talking and excited feelings or actions that are out of control -hallucination, loss of contact with reality -seizures -suicidal thoughts or other mood changes -unusual bleeding or bruising Side effects that usually do not require medical attention (report to your doctor or health care professional if they continue or are bothersome): -blurred vision -changes in appetite -change in sex drive or performance -headache -increased sweating -nausea This list may not describe all possible side effects. Call your doctor for medical advice about side effects. You may report side effects to FDA at 1-800-FDA-1088. Where should I keep my medicine? Keep out of reach of children. Store at room temperature between 15 and 30 degrees C (59 and 86 degrees F). Throw away any unused medicine after the expiration date. NOTE: This sheet is a summary. It may not cover all possible information. If you have questions about this medicine, talk to your doctor, pharmacist, or health care provider.  2015, [employer]. (2012-11-06 12:32:55)

## 2014-07-14 ENCOUNTER — Other Ambulatory Visit: Payer: Self-pay | Admitting: *Deleted

## 2014-07-14 MED ORDER — OSELTAMIVIR PHOSPHATE 75 MG PO CAPS: 75.0000 mg | ORAL_CAPSULE | Freq: Every day | ORAL | Status: DC

## 2014-07-14 NOTE — Telephone Encounter (Signed)
Lorraine [place]son has flu. Calling in tamiflu for family

## 2014-08-15 ENCOUNTER — Ambulatory Visit (INDEPENDENT_AMBULATORY_CARE_PROVIDER_SITE_OTHER): Payer: BLUE CROSS/BLUE SHIELD | Admitting: Family

## 2014-08-15 ENCOUNTER — Encounter: Payer: Self-pay | Admitting: Family

## 2014-08-15 VITALS — BP 116/81 | HR 96 | Temp 97.2°F | Ht 64.0 in | Wt 173.0 lb

## 2014-08-15 DIAGNOSIS — F411 Generalized anxiety disorder: Secondary | ICD-10-CM

## 2014-08-15 DIAGNOSIS — Z0000 Encounter for general adult medical examination without abnormal findings: Secondary | ICD-10-CM | POA: Diagnosis not present

## 2014-08-15 DIAGNOSIS — N951 Menopausal and female climacteric states: Secondary | ICD-10-CM

## 2014-08-15 DIAGNOSIS — Z01419 Encounter for gynecological examination (general) (routine) without abnormal findings: Secondary | ICD-10-CM | POA: Diagnosis not present

## 2014-08-15 LAB — POCT URINALYSIS DIPSTICK
Bilirubin, UA: NEGATIVE
Glucose, UA: NEGATIVE
Ketones, UA: NEGATIVE
LEUKOCYTES UA: NEGATIVE
Nitrite, UA: NEGATIVE
PH UA: 6
Protein, UA: NEGATIVE
RBC UA: NEGATIVE
Spec Grav, UA: 1.02
UROBILINOGEN UA: NEGATIVE

## 2014-08-15 LAB — POCT UA - MICROSCOPIC ONLY
Bacteria, U Microscopic: NEGATIVE
CASTS, UR, LPF, POC: NEGATIVE
Crystals, Ur, HPF, POC: NEGATIVE
WBC, UR, HPF, POC: NEGATIVE
YEAST UA: NEGATIVE

## 2014-08-15 MED ORDER — ALPRAZOLAM 0.25 MG PO TABS: 0.2500 mg | ORAL_TABLET | Freq: Two times a day (BID) | ORAL | Status: DC | PRN

## 2014-08-15 MED ORDER — ATORVASTATIN CALCIUM 10 MG PO TABS: 10.0000 mg | ORAL_TABLET | Freq: Every day | ORAL | Status: DC

## 2014-08-15 NOTE — Addendum Note (Signed)
Addended by: Prescott GumLAND, Geo Slone M on: 08/15/2014 03:59 PM   Modules accepted: Kipp BroodSmartSet

## 2014-08-15 NOTE — Progress Notes (Signed)
Subjective:    Patient ID: Lorraine Mcdaniel, female    DOB: 1965-09-12, 49 y.o.   MRN: [id number]  Pt presents to the office today for CPE with pap.  Gynecologic Exam Pertinent negatives include no headaches.  Anxiety Presents for follow-up visit. Symptoms include excessive worry and nervous/anxious behavior. Patient reports no decreased concentration, depressed mood, insomnia, irritability, palpitations, panic or shortness of breath. Symptoms occur occasionally. The symptoms are aggravated by family issues and medication. The quality of sleep is good.   Her past medical history is significant for anxiety/panic attacks. There is no history of depression.  Hyperlipidemia This is a chronic problem. The current episode started more than 1 year ago. The problem is uncontrolled. Recent lipid tests were reviewed and are high. She has no history of diabetes or hypothyroidism. Factors aggravating her hyperlipidemia include smoking. Pertinent negatives include no leg pain or shortness of breath. Current antihyperlipidemic treatment includes statins. The current treatment provides mild improvement of lipids. Risk factors for coronary artery disease include dyslipidemia, post-menopausal and family history.      Review of Systems  Constitutional: Negative.  Negative for irritability.  HENT: Negative.   Eyes: Negative.   Respiratory: Negative.  Negative for shortness of breath.   Cardiovascular: Negative.  Negative for palpitations.  Gastrointestinal: Negative.   Endocrine: Negative.   Genitourinary: Negative.   Musculoskeletal: Negative.   Neurological: Negative.  Negative for headaches.  Hematological: Negative.   Psychiatric/Behavioral: Negative for decreased concentration. The patient is nervous/anxious. The patient does not have insomnia.   All other systems reviewed and are negative.      Objective:   Physical Exam  Constitutional: She is oriented to person, place, and time. She appears  well-developed and well-nourished. No distress.  HENT:  Head: Normocephalic and atraumatic.  Right Ear: External ear normal.  Left Ear: External ear normal.  Nose: Nose normal.  Mouth/Throat: Oropharynx is clear and moist.  Eyes: Pupils are equal, round, and reactive to light.  Neck: Normal range of motion. Neck supple. No thyromegaly present.  Cardiovascular: Normal rate, regular rhythm, normal heart sounds and intact distal pulses.   No murmur heard. Pulmonary/Chest: Effort normal and breath sounds normal. No respiratory distress. She has no wheezes. Right breast exhibits no inverted nipple, no mass, no nipple discharge, no skin change and no tenderness. Left breast exhibits no inverted nipple, no mass, no nipple discharge, no skin change and no tenderness. Breasts are symmetrical.  Abdominal: Soft. Bowel sounds are normal. She exhibits no distension. There is no tenderness.  Genitourinary: Vagina normal.  Bimanual exam- no adnexal masses or tenderness, ovaries nonpalpable    No discharge   Musculoskeletal: Normal range of motion. She exhibits no edema or tenderness.  Neurological: She is alert and oriented to person, place, and time. She has normal reflexes. No cranial nerve deficit.  Skin: Skin is warm and dry.  Psychiatric: She has a normal mood and affect. Her behavior is normal. Judgment and thought content normal.  Vitals reviewed.   BP 116/81 mmHg  Pulse 96  Temp(Src) 97.2 F (36.2 C) (Oral)  Ht 5\' 4"  (1.626 m)  Wt 173 lb (78.472 kg)  BMI 29.68 kg/m2       Assessment & Plan:  1. Encounter for routine gynecological examination - POCT urinalysis dipstick - POCT UA - Microscopic Only - CMP14+EGFR - Pap IG w/ reflex to HPV when ASC-U - atorvastatin (LIPITOR) 10 MG tablet; Take 1 tablet (10 mg total) by mouth daily.  Dispense:  90 tablet; Refill: 1  2. GAD (generalized anxiety disorder) - CMP14+EGFR - ALPRAZolam (XANAX) 0.25 MG tablet; Take 1 tablet (0.25 mg total) by  mouth 2 (two) times daily as needed for anxiety.  Dispense: 30 tablet; Refill: 3  3. Annual physical exam - CMP14+EGFR - Lipid panel - Thyroid Panel With TSH - Vit D  25 hydroxy (rtn osteoporosis monitoring) - Pap IG w/ reflex to HPV when ASC-U  4. Menopausal symptoms - FSH/LH   Continue all meds Labs pending Health Maintenance reviewed Diet and exercise encouraged RTO 1 year  Evelina Dun, FNP

## 2014-08-15 NOTE — Patient Instructions (Signed)

## 2014-08-16 LAB — LIPID PANEL
CHOLESTEROL TOTAL: 162 mg/dL (ref 100–199)
Chol/HDL Ratio: 4.5 ratio units — ABNORMAL HIGH (ref 0.0–4.4)
HDL: 36 mg/dL — ABNORMAL LOW (ref >39)
LDL CALC: 78 mg/dL (ref 0–99)
Triglycerides: 240 mg/dL — ABNORMAL HIGH (ref 0–149)
VLDL CHOLESTEROL CAL: 48 mg/dL — AB (ref 5–40)

## 2014-08-16 LAB — FSH/LH
FSH: 13.3 m[IU]/mL
LH: 10.4 m[IU]/mL

## 2014-08-16 LAB — CMP14+EGFR
ALBUMIN: 4.4 g/dL (ref 3.5–5.5)
ALT: 13 IU/L (ref 0–32)
AST: 12 IU/L (ref 0–40)
Albumin/Globulin Ratio: 1.9 (ref 1.1–2.5)
Alkaline Phosphatase: 72 IU/L (ref 39–117)
BUN / CREAT RATIO: 9 (ref 9–23)
BUN: 7 mg/dL (ref 6–24)
Bilirubin Total: <0.2 mg/dL (ref 0.0–1.2)
CO2: 25 mmol/L (ref 18–29)
Calcium: 9.7 mg/dL (ref 8.7–10.2)
Chloride: 101 mmol/L (ref 97–108)
Creatinine, Ser: 0.76 mg/dL (ref 0.57–1.00)
GFR calc Af Amer: 107 mL/min/{1.73_m2} (ref >59)
GFR calc non Af Amer: 92 mL/min/{1.73_m2} (ref >59)
Globulin, Total: 2.3 g/dL (ref 1.5–4.5)
Glucose: 96 mg/dL (ref 65–99)
POTASSIUM: 4.6 mmol/L (ref 3.5–5.2)
Sodium: 141 mmol/L (ref 134–144)
Total Protein: 6.7 g/dL (ref 6.0–8.5)

## 2014-08-16 LAB — THYROID PANEL WITH TSH
Free Thyroxine Index: 2.3 (ref 1.2–4.9)
T3 Uptake Ratio: 22 % — ABNORMAL LOW (ref 24–39)
T4 TOTAL: 10.6 ug/dL (ref 4.5–12.0)
TSH: 1.46 u[IU]/mL (ref 0.450–4.500)

## 2014-08-16 LAB — VITAMIN D 25 HYDROXY (VIT D DEFICIENCY, FRACTURES): Vit D, 25-Hydroxy: 20.8 ng/mL — ABNORMAL LOW (ref 30.0–100.0)

## 2014-08-20 ENCOUNTER — Telehealth: Payer: Self-pay | Admitting: *Deleted

## 2014-08-20 ENCOUNTER — Other Ambulatory Visit: Payer: Self-pay | Admitting: Family

## 2014-08-20 DIAGNOSIS — E559 Vitamin D deficiency, unspecified: Secondary | ICD-10-CM

## 2014-08-20 LAB — PAP IG W/ RFLX HPV ASCU: PAP Smear Comment: 0

## 2014-08-20 MED ORDER — VITAMIN D (ERGOCALCIFEROL) 1.25 MG (50000 UNIT) PO CAPS: 50000.0000 [IU] | ORAL_CAPSULE | ORAL | Status: DC

## 2014-08-20 NOTE — Telephone Encounter (Signed)
-----   Message from [name], FNP sent at 08/20/2014  8:47 AM EDT ----- Kidney and liver function stable LDL WNL HDL- Low- Pt would would benefit from Fish oil daily and low fat diet Thyroid levels stable Vit D levels low- Prescription sent to pharmacy  Pap negative for lesion and malignancy Hormones- Pt not in menopause

## 2014-08-20 NOTE — Progress Notes (Signed)
Patient aware.

## 2014-10-06 ENCOUNTER — Ambulatory Visit (INDEPENDENT_AMBULATORY_CARE_PROVIDER_SITE_OTHER): Payer: BLUE CROSS/BLUE SHIELD | Admitting: Family Medicine

## 2014-10-06 ENCOUNTER — Encounter: Payer: Self-pay | Admitting: Family Medicine

## 2014-10-06 DIAGNOSIS — L259 Unspecified contact dermatitis, unspecified cause: Secondary | ICD-10-CM

## 2014-10-06 DIAGNOSIS — E785 Hyperlipidemia, unspecified: Secondary | ICD-10-CM | POA: Insufficient documentation

## 2014-10-06 MED ORDER — PIMECROLIMUS 1 % EX CREA: TOPICAL_CREAM | Freq: Two times a day (BID) | CUTANEOUS | Status: DC

## 2014-10-06 MED ORDER — MOMETASONE FUROATE 0.1 % EX CREA: 1.0000 "application " | TOPICAL_CREAM | Freq: Every day | CUTANEOUS | Status: DC

## 2014-10-06 NOTE — Addendum Note (Signed)
Addended by: Bearl Mulberry on: 10/06/2014 10:27 AM   Modules accepted: Orders

## 2014-10-06 NOTE — Progress Notes (Signed)
Subjective:  Patient ID: Lorraine Mcdaniel, female    DOB: [birth year]  Age: 49 y.o. MRN: [id number]  CC: Rash   HPI Lorraine Mcdaniel presents for several days of facial rash. Initially thought of it is poison ivy. She used some cortisone cream on her face and it just seemed to get worse. She is allergic to prednisone and therefore is concerned about use of any steroid-induced. She is worried that the cortisone cream may actually now be the source of the rash itself. She says that it itches and burns. She actually got some relief this morning by heating the facial rash with a hair dryer. The location is on the right face primarily. However some involvement on the left side was noted a few days ago it seems to be getting better.  History Lorraine Mcdaniel has a past medical history of Anxiety; Hyperlipidemia; and Vitamin D deficiency.   She has past surgical history that includes Abdominal hysterectomy (1991).   Her family history includes Arthritis in her mother; Heart disease in her father and mother; Hypertension in her father; Stroke in her father.She reports that she has been smoking Cigarettes.  She started smoking about 43 years ago. She has been smoking about 0.75 packs per day. She does not have any smokeless tobacco history on file. She reports that she does not drink alcohol or use illicit drugs.  Outpatient Prescriptions Prior to Visit  Medication Sig Dispense Refill  . ALPRAZolam (XANAX) 0.25 MG tablet Take 1 tablet (0.25 mg total) by mouth 2 (two) times daily as needed for anxiety. 30 tablet 3  . atorvastatin (LIPITOR) 10 MG tablet Take 1 tablet (10 mg total) by mouth daily. 90 tablet 1  . Vitamin D, Ergocalciferol, (DRISDOL) 50000 UNITS CAPS capsule Take 1 capsule (50,000 Units total) by mouth every 7 (seven) days. 12 capsule 3   No facility-administered medications prior to visit.    ROS Review of Systems  Constitutional: Negative for fever, chills, diaphoresis, appetite change and fatigue.    HENT: Negative for congestion, ear pain, hearing loss, postnasal drip, rhinorrhea, sore throat and trouble swallowing.   Respiratory: Negative for cough, chest tightness and shortness of breath.   Cardiovascular: Negative for chest pain and palpitations.  Gastrointestinal: Negative for abdominal pain.  Musculoskeletal: Negative for arthralgias.    Objective:  BP 139/89 mmHg  Pulse 87  Temp(Src) 97.5 F (36.4 C) (Oral)  Ht 5\' 4"  (1.626 m)  Wt 175 lb 9.6 oz (79.652 kg)  BMI 30.13 kg/m2  BP Readings from Last 3 Encounters:  10/06/14 139/89  08/15/14 116/81  07/10/14 137/92    Wt Readings from Last 3 Encounters:  10/06/14 175 lb 9.6 oz (79.652 kg)  08/15/14 173 lb (78.472 kg)  07/10/14 173 lb 6.4 oz (78.654 kg)     Physical Exam  Constitutional: She is oriented to person, place, and time. She appears well-developed and well-nourished. No distress.  HENT:  Head: Normocephalic and atraumatic.  Right Ear: Tympanic membrane and external ear normal. No decreased hearing is noted.  Left Ear: Tympanic membrane and external ear normal. No decreased hearing is noted.  Nose: Nose normal. Right sinus exhibits no frontal sinus tenderness. Left sinus exhibits no frontal sinus tenderness.  Mouth/Throat: Oropharynx is clear and moist. No oropharyngeal exudate or posterior oropharyngeal erythema.  Neck: No Brudzinski's sign noted.  Cardiovascular: Normal rate, regular rhythm and normal heart sounds.   No murmur heard. Pulmonary/Chest: Effort normal and breath sounds normal. No respiratory distress. She has no  wheezes. She has no rales.  Abdominal: Soft.  Lymphadenopathy:       Right cervical: No superficial cervical adenopathy present.      Left cervical: No superficial cervical adenopathy present.  Neurological: She is alert and oriented to person, place, and time. She has normal reflexes.  Skin: Skin is warm and dry.  Psychiatric: She has a normal mood and affect. Her behavior is  normal. Judgment and thought content normal.    No results found for: HGBA1C  Lab Results  Component Value Date   WBC 8.6 07/04/2014   HGB 14.0 07/04/2014   HCT 43.4 07/04/2014   PLT 350 07/04/2014   GLUCOSE 96 08/15/2014   CHOL 162 08/15/2014   TRIG 240* 08/15/2014   HDL 36* 08/15/2014   LDLCALC 78 08/15/2014   ALT 13 08/15/2014   AST 12 08/15/2014   NA 141 08/15/2014   K 4.6 08/15/2014   CL 101 08/15/2014   CREATININE 0.76 08/15/2014   BUN 7 08/15/2014   CO2 25 08/15/2014   TSH 1.460 08/15/2014   INR 0.96 07/04/2014    Dg Chest 2 View  07/04/2014   CLINICAL DATA:  Left arm and chest pain.  Tachycardia.  EXAM: CHEST  2 VIEW  COMPARISON:  None.  FINDINGS: Mild hyperinflation. Numerous leads and wires project over the chest. Midline trachea. Normal heart size and mediastinal contours. No pleural effusion or pneumothorax. mild biapical pleural thickening. Clear lungs.  IMPRESSION: Hyperinflation, without acute disease.   Electronically Signed   By: Jeronimo Greaves M.D.   On: 07/04/2014 10:40    Assessment & Plan:   Lorraine Mcdaniel was seen today for rash.  Diagnoses and all orders for this visit:  Contact dermatitis  Other orders -     pimecrolimus (ELIDEL) 1 % cream; Apply topically 2 (two) times daily.   I am having [name] start on pimecrolimus. I am also having her maintain her ALPRAZolam, atorvastatin, and Vitamin D (Ergocalciferol).  Meds ordered this encounter  Medications  . pimecrolimus (ELIDEL) 1 % cream    Sig: Apply topically 2 (two) times daily.    Dispense:  30 g    Refill:  0     Follow-up: Return if symptoms worsen or fail to improve.  Mechele Claude, M.D.

## 2014-10-09 ENCOUNTER — Encounter: Payer: Self-pay | Admitting: Family Medicine

## 2014-10-09 ENCOUNTER — Encounter: Payer: Self-pay | Admitting: *Deleted

## 2014-10-09 ENCOUNTER — Ambulatory Visit (INDEPENDENT_AMBULATORY_CARE_PROVIDER_SITE_OTHER): Payer: BLUE CROSS/BLUE SHIELD | Admitting: Family Medicine

## 2014-10-09 VITALS — BP 133/89 | HR 99 | Temp 97.5°F | Ht 64.0 in | Wt 174.0 lb

## 2014-10-09 DIAGNOSIS — L259 Unspecified contact dermatitis, unspecified cause: Secondary | ICD-10-CM

## 2014-10-09 MED ORDER — PREDNISONE 10 MG PO TABS: ORAL_TABLET | ORAL | Status: DC

## 2014-10-09 NOTE — Patient Instructions (Addendum)
Take medicine as directed - STOP KEFLEX  Use Claritin and zantac as directed Take Prednisone as directed Use cool compressions on face Use only Aveeno wash  Take Benadryl 25 mg 4 times daily Use cold wet compresses Remain out of work until rash is cleared Call back if any worsening of rash after taking prednisone    Poison [employer] ivy is a inflammation of the skin (contact dermatitis) caused by touching the allergens on the leaves of the ivy plant following previous exposure to the plant. The rash usually appears 48 hours after exposure. The rash is usually bumps (papules) or blisters (vesicles) in a linear pattern. Depending on your own sensitivity, the rash may simply cause redness and itching, or it may also progress to blisters which may break open. These must be well cared for to prevent secondary bacterial (germ) infection, followed by scarring. Keep any open areas dry, clean, dressed, and covered with an antibacterial ointment if needed. The eyes may also get puffy. The puffiness is worst in the morning and gets better as the day progresses. This dermatitis usually heals without scarring, within 2 to 3 weeks without treatment. HOME CARE INSTRUCTIONS  Thoroughly wash with soap and water as soon as you have been exposed to poison ivy. You have about one half hour to remove the plant resin before it will cause the rash. This washing will destroy the oil or antigen on the skin that is causing, or will cause, the rash. Be sure to wash under your fingernails as any plant resin there will continue to spread the rash. Do not rub skin vigorously when washing affected area. Poison ivy cannot spread if no oil from the plant remains on your body. A rash that has progressed to weeping sores will not spread the rash unless you have not washed thoroughly. It is also important to wash any clothes you have been wearing as these may carry active allergens. The rash will return if you wear the unwashed  clothing, even several days later. Avoidance of the plant in the future is the best measure. Poison ivy plant can be recognized by the number of leaves. Generally, poison ivy has three leaves with flowering branches on a single stem. Diphenhydramine may be purchased over the counter and used as needed for itching. Do not drive with this medication if it makes you drowsy.Ask your caregiver about medication for children. SEEK MEDICAL CARE IF:  Open sores develop.  Redness spreads beyond area of rash.  You notice purulent (pus-like) discharge.  You have increased pain.  Other signs of infection develop (such as fever). Document Released: 04/08/2000 Document Revised: 07/04/2011 Document Reviewed: 09/19/2008 [facility] Patient Information 2015 [place], [place]. This information is not intended to replace advice given to you by your health care provider. Make sure you discuss any questions you have with your health care provider.

## 2014-10-09 NOTE — Progress Notes (Signed)
Subjective:    Patient ID: Lorraine Mcdaniel, female    DOB: [birth year], 49 y.o.   MRN: [id number]  HPI Patient here today for follow up on poison oak and she is now having facial swelling. She was seen by Dr Darlyn Read on Monday and was given a cream. The patient uses all detergent, dermatology approved, she uses scent free fabric softeners. She is recently has used [employer] free. She also indicates that she is put Don detergent on her face not seem on her face and some hand cleansing material on her face from work. She is a smoker she goes out in the heat to smoke. 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 She says the sensation in her face is more burning than it is itching. She thinks that she was exposed to a grandchild who had poison ivy on him and this is where she got this from. The nurse at work recently put her on Keflex 500 twice daily and Claritin and Zantac. She is only taking 2 of the Keflex.       Patient Active Problem List   Diagnosis Date Noted  . Hyperlipidemia 10/06/2014  . Contact dermatitis 10/06/2014  . Vitamin D deficiency 08/20/2014  . GAD (generalized anxiety disorder) 07/10/2014   Outpatient Encounter Prescriptions as of 10/09/2014  Medication Sig  . ALPRAZolam (XANAX) 0.25 MG tablet Take 1 tablet (0.25 mg total) by mouth 2 (two) times daily as needed for anxiety.  Marland Kitchen atorvastatin (LIPITOR) 10 MG tablet Take 1 tablet (10 mg total) by mouth daily.  . mometasone (ELOCON) 0.1 % cream Apply 1 application topically daily.  . Vitamin D, Ergocalciferol, (DRISDOL) 50000 UNITS CAPS capsule Take 1 capsule (50,000 Units total) by mouth every 7 (seven) days.  . [DISCONTINUED] pimecrolimus (ELIDEL) 1 % cream Apply topically 2 (two) times daily.   No facility-administered encounter medications on file as of 10/09/2014.     Review of Systems  Constitutional: Negative.   HENT: Negative.   Eyes: Negative.   Respiratory: Negative.     Cardiovascular: Negative.   Gastrointestinal: Negative.   Endocrine: Negative.   Genitourinary: Negative.   Musculoskeletal: Negative.   Skin: Positive for rash.       Facial edema  Allergic/Immunologic: Negative.   Neurological: Negative.   Hematological: Negative.   Psychiatric/Behavioral: Negative.        Objective:   Physical Exam  Constitutional: She is oriented to person, place, and time. She appears well-developed and well-nourished.  HENT:  Head: Normocephalic and atraumatic.  Eyes: Conjunctivae are normal. Pupils are equal, round, and reactive to light. Right eye exhibits no discharge. Left eye exhibits no discharge. No scleral icterus.  Neck: Normal range of motion.  Musculoskeletal: Normal range of motion.  Neurological: She is alert and oriented to person, place, and time.  Skin: Skin is warm and dry. Rash noted. There is erythema. No pallor.  Agent has a contact dermatitis that is pretty much confluent on both cheeks some on the for head anterior neck and upper chest. It is mostly on the exposed parts of skin and not under her clothing. There are no linear blisters.  Psychiatric: She has a normal mood and affect. Her behavior is normal. Judgment and thought content normal.  Nursing note and vitals reviewed.  BP 133/89 mmHg  Pulse 99  Temp(Src) 97.5 F (36.4 C) (Oral)  Ht 5\' 4"  (1.626 m)  Wt 174 lb (78.926 kg)  BMI 29.85  kg/m2  After reviewing the paper record and early 2009, the patient took a course of Biaxin and prednisone. Subsequently she called back in and had developed a rash in both of these medications were stopped at that time. She also developed thrush. In looking back further she had taken Depo-Medrol prednisone on multiple occasions and did not have a reaction to these at that time. I believe and I discussed with the patient that the reaction may have very well been related to taking the Biaxin and not the prednisone. This was discussed thoroughly with  her.      Assessment & Plan:  1. Contact dermatitis -This may be poison ivy or it may be a reaction to some of the chemicals that she may be exposed to at work -She is been encouraged to continue to take the Zantac twice a day, stop the Keflex, try the prednisone again and take a tapering dose over 8 days as directed. She will in addition take Benadryl 25 mg 4 times daily and use cold wet compresses to her face frequently. She will remain out of work until the rash is cleared. If she develops a rash on the rest of her body she will call us immediately and we may have to have her go to the emergency room. In the meantime she will call us back in about 24 hours for her progress from taking the prednisone tonight and tomorrow and the extra Benadryl.  Patient Instructions  Take medicine as directed - STOP KEFLEX  Use Claritin and zantac as directed Take Prednisone as directed Use cool compressions on face Use only Aveeno wash  Take Benadryl 25 mg 4 times daily Use cold wet compresses Remain out of work until rash is cleared Call back if any worsening of rash after taking prednisone    Poison [employer] ivy is a inflammation of the skin (contact dermatitis) caused by touching the allergens on the leaves of the ivy plant following previous exposure to the plant. The rash usually appears 48 hours after exposure. The rash is usually bumps (papules) or blisters (vesicles) in a linear pattern. Depending on your own sensitivity, the rash may simply cause redness and itching, or it may also progress to blisters which may break open. These must be well cared for to prevent secondary bacterial (germ) infection, followed by scarring. Keep any open areas dry, clean, dressed, and covered with an antibacterial ointment if needed. The eyes may also get puffy. The puffiness is worst in the morning and gets better as the day progresses. This dermatitis usually heals without scarring, within 2 to 3 weeks without  treatment. HOME CARE INSTRUCTIONS  Thoroughly wash with soap and water as soon as you have been exposed to poison ivy. You have about one half hour to remove the plant resin before it will cause the rash. This washing will destroy the oil or antigen on the skin that is causing, or will cause, the rash. Be sure to wash under your fingernails as any plant resin there will continue to spread the rash. Do not rub skin vigorously when washing affected area. Poison ivy cannot spread if no oil from the plant remains on your body. A rash that has progressed to weeping sores will not spread the rash unless you have not washed thoroughly. It is also important to wash any clothes you have been wearing as these may carry active allergens. The rash will return if you wear the unwashed clothing, even several days later.  Avoidance of the plant in the future is the best measure. Poison ivy plant can be recognized by the number of leaves. Generally, poison ivy has three leaves with flowering branches on a single stem. Diphenhydramine may be purchased over the counter and used as needed for itching. Do not drive with this medication if it makes you drowsy.Ask your caregiver about medication for children. SEEK MEDICAL CARE IF:  Open sores develop.  Redness spreads beyond area of rash.  You notice purulent (pus-like) discharge.  You have increased pain.  Other signs of infection develop (such as fever). Document Released: 04/08/2000 Document Revised: 07/04/2011 Document Reviewed: 09/19/2008 [facility] Patient Information 2015 [place], [place]. This information is not intended to replace advice given to you by your health care provider. Make sure you discuss any questions you have with your health care provider.    Nyra Capes MD

## 2014-10-17 ENCOUNTER — Emergency Department (HOSPITAL_COMMUNITY)
Admission: EM | Admit: 2014-10-17 | Discharge: 2014-10-17 | Disposition: A | Discharge status: Home / Self Care | Payer: BLUE CROSS/BLUE SHIELD | Attending: Emergency Medicine | Admitting: Emergency Medicine

## 2014-10-17 ENCOUNTER — Encounter (HOSPITAL_COMMUNITY): Payer: Self-pay | Admitting: Emergency Medicine

## 2014-10-17 DIAGNOSIS — R682 Dry mouth, unspecified: Secondary | ICD-10-CM | POA: Diagnosis not present

## 2014-10-17 DIAGNOSIS — R064 Hyperventilation: Secondary | ICD-10-CM | POA: Insufficient documentation

## 2014-10-17 DIAGNOSIS — E559 Vitamin D deficiency, unspecified: Secondary | ICD-10-CM | POA: Diagnosis not present

## 2014-10-17 DIAGNOSIS — R1084 Generalized abdominal pain: Secondary | ICD-10-CM | POA: Insufficient documentation

## 2014-10-17 DIAGNOSIS — F458 Other somatoform disorders: Secondary | ICD-10-CM

## 2014-10-17 DIAGNOSIS — Z792 Long term (current) use of antibiotics: Secondary | ICD-10-CM | POA: Diagnosis not present

## 2014-10-17 DIAGNOSIS — E785 Hyperlipidemia, unspecified: Secondary | ICD-10-CM | POA: Insufficient documentation

## 2014-10-17 DIAGNOSIS — R197 Diarrhea, unspecified: Secondary | ICD-10-CM

## 2014-10-17 DIAGNOSIS — Z90710 Acquired absence of both cervix and uterus: Secondary | ICD-10-CM | POA: Diagnosis not present

## 2014-10-17 DIAGNOSIS — R200 Anesthesia of skin: Secondary | ICD-10-CM | POA: Diagnosis not present

## 2014-10-17 DIAGNOSIS — Z79899 Other long term (current) drug therapy: Secondary | ICD-10-CM | POA: Insufficient documentation

## 2014-10-17 DIAGNOSIS — F419 Anxiety disorder, unspecified: Secondary | ICD-10-CM | POA: Insufficient documentation

## 2014-10-17 DIAGNOSIS — Z720 Tobacco use: Secondary | ICD-10-CM | POA: Insufficient documentation

## 2014-10-17 DIAGNOSIS — Z7952 Long term (current) use of systemic steroids: Secondary | ICD-10-CM | POA: Diagnosis not present

## 2014-10-17 DIAGNOSIS — R112 Nausea with vomiting, unspecified: Secondary | ICD-10-CM | POA: Diagnosis not present

## 2014-10-17 LAB — URINALYSIS, ROUTINE W REFLEX MICROSCOPIC
BILIRUBIN URINE: NEGATIVE
Glucose, UA: NEGATIVE mg/dL
Hgb urine dipstick: NEGATIVE
KETONES UR: NEGATIVE mg/dL
Leukocytes, UA: NEGATIVE
NITRITE: NEGATIVE
PROTEIN: NEGATIVE mg/dL
Specific Gravity, Urine: <1.005 — ABNORMAL LOW (ref 1.005–1.030)
UROBILINOGEN UA: 0.2 mg/dL (ref 0.0–1.0)
pH: 5.5 (ref 5.0–8.0)

## 2014-10-17 LAB — COMPREHENSIVE METABOLIC PANEL
ALBUMIN: 3.6 g/dL (ref 3.5–5.0)
ALT: 18 U/L (ref 14–54)
AST: 18 U/L (ref 15–41)
Alkaline Phosphatase: 59 U/L (ref 38–126)
Anion gap: 10 (ref 5–15)
BUN: 11 mg/dL (ref 6–20)
CALCIUM: 8.5 mg/dL — AB (ref 8.9–10.3)
CO2: 25 mmol/L (ref 22–32)
Chloride: 102 mmol/L (ref 101–111)
Creatinine, Ser: 0.73 mg/dL (ref 0.44–1.00)
GFR calc Af Amer: >60 mL/min (ref >60)
GFR calc non Af Amer: >60 mL/min (ref >60)
Glucose, Bld: 112 mg/dL — ABNORMAL HIGH (ref 65–99)
POTASSIUM: 3.1 mmol/L — AB (ref 3.5–5.1)
SODIUM: 137 mmol/L (ref 135–145)
Total Bilirubin: 0.4 mg/dL (ref 0.3–1.2)
Total Protein: 6.6 g/dL (ref 6.5–8.1)

## 2014-10-17 LAB — CBC WITH DIFFERENTIAL/PLATELET
Basophils Absolute: 0.1 10*3/uL (ref 0.0–0.1)
Basophils Relative: 0 % (ref 0–1)
Eosinophils Absolute: 0.2 10*3/uL (ref 0.0–0.7)
Eosinophils Relative: 1 % (ref 0–5)
HEMATOCRIT: 41 % (ref 36.0–46.0)
Hemoglobin: 13.4 g/dL (ref 12.0–15.0)
LYMPHS PCT: 20 % (ref 12–46)
Lymphs Abs: 3.9 10*3/uL (ref 0.7–4.0)
MCH: 27.9 pg (ref 26.0–34.0)
MCHC: 32.7 g/dL (ref 30.0–36.0)
MCV: 85.4 fL (ref 78.0–100.0)
MONO ABS: 1.1 10*3/uL — AB (ref 0.1–1.0)
Monocytes Relative: 5 % (ref 3–12)
NEUTROS ABS: 14.3 10*3/uL — AB (ref 1.7–7.7)
Neutrophils Relative %: 74 % (ref 43–77)
Platelets: 351 10*3/uL (ref 150–400)
RBC: 4.8 MIL/uL (ref 3.87–5.11)
RDW: 14.6 % (ref 11.5–15.5)
WBC: 19.6 10*3/uL — AB (ref 4.0–10.5)

## 2014-10-17 LAB — LIPASE, BLOOD: LIPASE: 23 U/L (ref 22–51)

## 2014-10-17 LAB — TROPONIN I: Troponin I: <0.03 ng/mL (ref <0.031)

## 2014-10-17 MED ORDER — LORAZEPAM 2 MG/ML IJ SOLN
1.0000 mg | Freq: Once | INTRAMUSCULAR | Status: AC
Start: 2014-10-17 — End: 2014-10-17
  Administered 2014-10-17: 1 mg via INTRAVENOUS
  Filled 2014-10-17: qty 1

## 2014-10-17 MED ORDER — GI COCKTAIL ~~LOC~~
30.0000 mL | Freq: Once | ORAL | Status: AC
  Administered 2014-10-17: 30 mL via ORAL
  Filled 2014-10-17: qty 30

## 2014-10-17 MED ORDER — DIPHENOXYLATE-ATROPINE 2.5-0.025 MG PO TABS: 1.0000 | ORAL_TABLET | Freq: Four times a day (QID) | ORAL | Status: DC | PRN

## 2014-10-17 MED ORDER — SODIUM CHLORIDE 0.9 % IV BOLUS (SEPSIS)
500.0000 mL | Freq: Once | INTRAVENOUS | Status: AC
  Administered 2014-10-17: 500 mL via INTRAVENOUS

## 2014-10-17 MED ORDER — ONDANSETRON HCL 4 MG PO TABS: 4.0000 mg | ORAL_TABLET | Freq: Four times a day (QID) | ORAL | Status: DC

## 2014-10-17 MED ORDER — HYDROXYZINE HCL 25 MG PO TABS: 25.0000 mg | ORAL_TABLET | Freq: Four times a day (QID) | ORAL | Status: DC | PRN

## 2014-10-17 MED ORDER — METOCLOPRAMIDE HCL 5 MG/ML IJ SOLN
10.0000 mg | Freq: Once | INTRAMUSCULAR | Status: AC
  Administered 2014-10-17: 10 mg via INTRAVENOUS

## 2014-10-17 MED ORDER — METOCLOPRAMIDE HCL 5 MG/ML IJ SOLN
INTRAMUSCULAR | Status: AC
  Filled 2014-10-17: qty 2

## 2014-10-17 MED ORDER — ONDANSETRON HCL 4 MG/2ML IJ SOLN
4.0000 mg | Freq: Once | INTRAMUSCULAR | Status: AC
  Administered 2014-10-17: 4 mg via INTRAVENOUS
  Filled 2014-10-17: qty 2

## 2014-10-17 MED ORDER — PANTOPRAZOLE SODIUM 20 MG PO TBEC: 20.0000 mg | DELAYED_RELEASE_TABLET | Freq: Every day | ORAL | Status: DC

## 2014-10-17 MED ORDER — POTASSIUM CHLORIDE CRYS ER 20 MEQ PO TBCR
40.0000 meq | EXTENDED_RELEASE_TABLET | Freq: Once | ORAL | Status: AC
  Administered 2014-10-17: 40 meq via ORAL
  Filled 2014-10-17: qty 2

## 2014-10-17 NOTE — ED Provider Notes (Signed)
CSN: [id number]     Arrival date & time 10/17/14  0932 History  This chart was scribed for Gilda Crease, MD by Phillis Haggis, ED Scribe. This patient was seen in room APA11/[facility]11 and patient care was started at 9:48 AM.     Chief Complaint  Patient presents with  . Emesis   The history is provided by the patient. No language interpreter was used.  HPI Comments: Jenelle Malen is a 49 y.o. female who presents to the Emergency Department complaining of emesis onset this morning. She states that she had poison oak on her face last week and was placed on prednisone and benadryl. She states that she went to work this morning and began to feel "not right." She reports associated numbness "all over," in the face, arms and legs, with dry mouth. She states that she threw up once today and states that it tasted like the prednisone pills. She states that she also had diarrhea this morning. She states that she has finished her prednisone taper.    Past Medical History  Diagnosis Date  . Anxiety   . Hyperlipidemia   . Vitamin D deficiency    Past Surgical History  Procedure Laterality Date  . Abdominal hysterectomy  1991   Family History  Problem Relation Age of Onset  . Heart disease Mother   . Arthritis Mother   . Stroke Father   . Hypertension Father   . Heart disease Father    History  Substance Use Topics  . Smoking status: Current Every Day Smoker -- 0.75 packs/day    Types: Cigarettes    Start date: 11/19/1970  . Smokeless tobacco: Not on file  . Alcohol Use: Yes     Comment: occasionally   OB History    No data available     Review of Systems  HENT:       Dry mouth  Gastrointestinal: Positive for nausea, vomiting and diarrhea.  Neurological: Positive for numbness.  All other systems reviewed and are negative.   Allergies  Prednisone; Percocet; and Sulfa antibiotics  Home Medications   Prior to Admission medications   Medication Sig Start Date End Date Taking?  Authorizing Provider  ALPRAZolam (XANAX) 0.25 MG tablet Take 1 tablet (0.25 mg total) by mouth 2 (two) times daily as needed for anxiety. 08/15/14   Junie Spencer, FNP  atorvastatin (LIPITOR) 10 MG tablet Take 1 tablet (10 mg total) by mouth daily. 08/15/14   Junie Spencer, FNP  cephALEXin (KEFLEX) 500 MG capsule Take 500 mg by mouth 2 (two) times daily.    Historical Provider, MD  loratadine (CLARITIN) 10 MG tablet Take 10 mg by mouth daily.    Historical Provider, MD  mometasone (ELOCON) 0.1 % cream Apply 1 application topically daily. 10/06/14   Mechele Claude, MD  predniSONE (DELTASONE) 10 MG tablet TAPER- 1 tab by mouth four times daily for 2 days, 1 tab by mouth three times daily for 2 days, 1 tab by mouth twice a day for 2 days, then 1 tab by mouth daily for 2 days, then stop. 10/09/14   Ernestina Penna, MD  ranitidine (ZANTAC) 150 MG capsule Take 150 mg by mouth 2 (two) times daily.    Historical Provider, MD  Vitamin D, Ergocalciferol, (DRISDOL) 50000 UNITS CAPS capsule Take 1 capsule (50,000 Units total) by mouth every 7 (seven) days. 08/20/14   Christy A Hawks, FNP   BP 146/90 mmHg  Pulse 75  Temp(Src) 97.6 F (  36.4 C) (Oral)  Resp 20  Ht  (1.626 m)  Wt 174 lb (78.926 kg)  BMI 29.85 kg/m2  SpO2 100%  Physical Exam  Constitutional: She is oriented to person, place, and time. She appears well-developed and well-nourished. No distress.  HENT:  Head: Normocephalic and atraumatic.  Right Ear: Hearing normal.  Left Ear: Hearing normal.  Nose: Nose normal.  Mouth/Throat: Oropharynx is clear and moist and mucous membranes are normal.  Eyes: Conjunctivae and EOM are normal. Pupils are equal, round, and reactive to light.  Neck: Normal range of motion. Neck supple.  Cardiovascular: Regular rhythm, S1 normal and S2 normal.  Exam reveals no gallop and no friction rub.   No murmur heard. Pulmonary/Chest: Effort normal and breath sounds normal. No respiratory distress. She exhibits no  tenderness.  Abdominal: Soft. Normal appearance and bowel sounds are normal. There is no hepatosplenomegaly. There is tenderness. There is no rebound, no guarding, no tenderness at McBurney's point and negative Murphy's sign. No hernia.  Mild diffuse abdominal tenderness  Musculoskeletal: Normal range of motion.  Neurological: She is alert and oriented to person, place, and time. She has normal strength. No cranial nerve deficit or sensory deficit. Coordination normal. GCS eye subscore is 4. GCS verbal subscore is 5. GCS motor subscore is 6.  Skin: Skin is warm, dry and intact. No rash noted. No cyanosis.  Psychiatric: Her speech is normal and behavior is normal. Thought content normal. Her mood appears anxious.  Nursing note and vitals reviewed.   ED Course  Procedures (including critical care time) DIAGNOSTIC STUDIES: Oxygen Saturation is 100% on RA, normal by my interpretation.    COORDINATION OF CARE: 9:51 AM-Discussed treatment plan which includes fluids and medications with pt at bedside and pt agreed to plan.   Labs Review Labs Reviewed  CBC WITH DIFFERENTIAL/PLATELET - Abnormal; Notable for the following:    WBC 19.6 (*)    Neutro Abs 14.3 (*)    Monocytes Absolute 1.1 (*)    All other components within normal limits  COMPREHENSIVE METABOLIC PANEL - Abnormal; Notable for the following:    Potassium 3.1 (*)    Glucose, Bld 112 (*)    Calcium 8.5 (*)    All other components within normal limits  LIPASE, BLOOD  URINALYSIS, ROUTINE W REFLEX MICROSCOPIC (NOT AT [facility])  TROPONIN I    Imaging Review No results found.   EKG Interpretation None      MDM   Final diagnoses:  None   nausea and vomiting Diarrhea Panic attack  Presents to the ER for evaluation of nausea and vomiting. She has had diarrhea associated as well. Symptoms began earlier today. Patient was recently on prednisone for contact dermatitis.  Patient reports numbness around her mouth with numbness and  tingling of the hands and feet bilaterally and symmetrically. This is consistent with hyperventilation syndrome. She was extremely anxious at arrival. Symptoms improved with IV fluids and Ativan.  Patient reported that her nausea resolved after Zofran. Abdominal exam is brain remained benign. She did start to complain of "heartburn". This was treated with GI cocktail successfully. EKG unremarkable, troponin negative. Symptoms are very atypical for cardiac etiology, including diarrhea, vomiting. She was felt appropriate for outpatient management by primary doctor. Will discharge with symptomatic treatment for nausea, vomiting, diarrhea.  I personally performed the services described in this documentation, which was scribed in my presence. The recorded information has been reviewed and is accurate.     Gilda Crease,  MD 10/17/14 1412

## 2014-10-17 NOTE — ED Notes (Signed)
Patient states she can not provide a urine sample.

## 2014-10-17 NOTE — ED Notes (Signed)
MD at bedside. 

## 2014-10-17 NOTE — Discharge Instructions (Signed)
Diarrhea Diarrhea is frequent loose and watery bowel movements. It can cause you to feel weak and dehydrated. Dehydration can cause you to become tired and thirsty, have a dry mouth, and have decreased urination that often is dark yellow. Diarrhea is a sign of another problem, most often an infection that will not last long. In most cases, diarrhea typically lasts 2-3 days. However, it can last longer if it is a sign of something more serious. It is important to treat your diarrhea as directed by your caregiver to lessen or prevent future episodes of diarrhea. CAUSES  Some common causes include:  Gastrointestinal infections caused by viruses, bacteria, or parasites.  Food poisoning or food allergies.  Certain medicines, such as antibiotics, chemotherapy, and laxatives.  Artificial sweeteners and fructose.  Digestive disorders. HOME CARE INSTRUCTIONS  Ensure adequate fluid intake (hydration): Have 1 cup (8 oz) of fluid for each diarrhea episode. Avoid fluids that contain simple sugars or sports drinks, fruit juices, whole milk products, and sodas. Your urine should be clear or pale yellow if you are drinking enough fluids. Hydrate with an oral rehydration solution that you can purchase at pharmacies, retail stores, and online. You can prepare an oral rehydration solution at home by mixing the following ingredients together:   - tsp table salt.   tsp baking soda.   tsp salt substitute containing potassium chloride.  1  tablespoons sugar.  1 L (34 oz) of water.  Certain foods and beverages may increase the speed at which food moves through the gastrointestinal (GI) tract. These foods and beverages should be avoided and include:  Caffeinated and alcoholic beverages.  High-fiber foods, such as raw fruits and vegetables, nuts, seeds, and whole grain breads and cereals.  Foods and beverages sweetened with sugar alcohols, such as xylitol, sorbitol, and mannitol.  Some foods may be well  tolerated and may help thicken stool including:  Starchy foods, such as rice, toast, pasta, low-sugar cereal, oatmeal, grits, baked potatoes, crackers, and bagels.  Bananas.  Applesauce.  Add probiotic-rich foods to help increase healthy bacteria in the GI tract, such as yogurt and fermented milk products.  Wash your hands well after each diarrhea episode.  Only take over-the-counter or prescription medicines as directed by your caregiver.  Take a warm bath to relieve any burning or pain from frequent diarrhea episodes. SEEK IMMEDIATE MEDICAL CARE IF:   You are unable to keep fluids down.  You have persistent vomiting.  You have blood in your stool, or your stools are black and tarry.  You do not urinate in 6-8 hours, or there is only a small amount of very dark urine.  You have abdominal pain that increases or localizes.  You have weakness, dizziness, confusion, or light-headedness.  You have a severe headache.  Your diarrhea gets worse or does not get better.  You have a fever or persistent symptoms for more than 2-3 days.  You have a fever and your symptoms suddenly get worse. MAKE SURE YOU:   Understand these instructions.  Will watch your condition.  Will get help right away if you are not doing well or get worse. Document Released: 04/01/2002 Document Revised: 08/26/2013 Document Reviewed: 12/18/2011 [facility] Patient Information 2015 [place], [place]. This information is not intended to replace advice given to you by your health care provider. Make sure you discuss any questions you have with your health care provider.  Hyperventilation Hyperventilation is breathing that is deeper and more rapid than normal. It is usually associated  with panic and anxiety. Hyperventilation can make you feel breathless. It is sometimes called overbreathing. Breathing out too much causes a decrease in the amount of carbon dioxide gas in the blood. This leads to tingling and numbness  in the hands, feet, and around the mouth. If this continues, your fingers, hands, and toes may begin to spasm. Hyperventilation usually lasts 20-30 minutes and can be associated with other symptoms of panic and anxiety, including:   Chest pains or tightness.  A pounding or irregular, racing heartbeat (palpitations).  Dizziness.  Lightheadedness.  Dry mouth.  Weakness.  Confusion.  Sleep disturbance. CAUSES  Sudden onset (acute) hyperventilation is usually triggered by acute stress, anxiety, or emotional upset. Long-term (chronic) and recurring hyperventilation can occur with chronic lung problems, such emphysema or asthma. Other causes include:   Nervousness.  Stress.  Stimulant, drug, or alcohol use.  Lung disease.  Infections, such as pneumonia.  Heart problems.  Severe pain.  Waking from a bad dream.  Pregnancy.  Bleeding. HOME CARE INSTRUCTIONS  Learn and use breathing exercises that help you breathe from your diaphragm and abdomen.  Practice relaxation techniques to reduce stress, such as visualization, meditation, and muscle release.  During an attack, try breathing into a paper bag. This changes the carbon dioxide level and slows down breathing. SEEK IMMEDIATE MEDICAL CARE IF:  Your hyperventilation continues or gets worse. MAKE SURE YOU:  Understand these instructions.  Will watch your condition.  Will get help right away if you are not doing well or get worse. Document Released: 04/08/2000 Document Revised: 10/11/2011 Document Reviewed: 07/21/2011 [facility] Patient Information 2015 [place], [place]. This information is not intended to replace advice given to you by your health care provider. Make sure you discuss any questions you have with your health care provider.  Nausea and Vomiting Nausea is a sick feeling that often comes before throwing up (vomiting). Vomiting is a reflex where stomach contents come out of your mouth. Vomiting can cause  severe loss of body fluids (dehydration). Children and elderly adults can become dehydrated quickly, especially if they also have diarrhea. Nausea and vomiting are symptoms of a condition or disease. It is important to find the cause of your symptoms. CAUSES   Direct irritation of the stomach lining. This irritation can result from increased acid production (gastroesophageal reflux disease), infection, food poisoning, taking certain medicines (such as nonsteroidal anti-inflammatory drugs), alcohol use, or tobacco use.  Signals from the brain.These signals could be caused by a headache, heat exposure, an inner ear disturbance, increased pressure in the brain from injury, infection, a tumor, or a concussion, pain, emotional stimulus, or metabolic problems.  An obstruction in the gastrointestinal tract (bowel obstruction).  Illnesses such as diabetes, hepatitis, gallbladder problems, appendicitis, kidney problems, cancer, sepsis, atypical symptoms of a heart attack, or eating disorders.  Medical treatments such as chemotherapy and radiation.  Receiving medicine that makes you sleep (general anesthetic) during surgery. DIAGNOSIS Your caregiver may ask for tests to be done if the problems do not improve after a few days. Tests may also be done if symptoms are severe or if the reason for the nausea and vomiting is not clear. Tests may include:  Urine tests.  Blood tests.  Stool tests.  Cultures (to look for evidence of infection).  X-rays or other imaging studies. Test results can help your caregiver make decisions about treatment or the need for additional tests. TREATMENT You need to stay well hydrated. Drink frequently but in small amounts.You may  wish to drink water, sports drinks, clear broth, or eat frozen ice pops or gelatin dessert to help stay hydrated.When you eat, eating slowly may help prevent nausea.There are also some antinausea medicines that may help prevent nausea. HOME  CARE INSTRUCTIONS   Take all medicine as directed by your caregiver.  If you do not have an appetite, do not force yourself to eat. However, you must continue to drink fluids.  If you have an appetite, eat a normal diet unless your caregiver tells you differently.  Eat a variety of complex carbohydrates (rice, wheat, potatoes, bread), lean meats, yogurt, fruits, and vegetables.  Avoid high-fat foods because they are more difficult to digest.  Drink enough water and fluids to keep your urine clear or pale yellow.  If you are dehydrated, ask your caregiver for specific rehydration instructions. Signs of dehydration may include:  Severe thirst.  Dry lips and mouth.  Dizziness.  Dark urine.  Decreasing urine frequency and amount.  Confusion.  Rapid breathing or pulse. SEEK IMMEDIATE MEDICAL CARE IF:   You have blood or brown flecks (like coffee grounds) in your vomit.  You have black or bloody stools.  You have a severe headache or stiff neck.  You are confused.  You have severe abdominal pain.  You have chest pain or trouble breathing.  You do not urinate at least once every 8 hours.  You develop cold or clammy skin.  You continue to vomit for longer than 24 to 48 hours.  You have a fever. MAKE SURE YOU:   Understand these instructions.  Will watch your condition.  Will get help right away if you are not doing well or get worse. Document Released: 04/11/2005 Document Revised: 07/04/2011 Document Reviewed: 09/08/2010 [facility] Patient Information 2015 [place], [place]. This information is not intended to replace advice given to you by your health care provider. Make sure you discuss any questions you have with your health care provider.

## 2014-10-17 NOTE — ED Notes (Addendum)
Patient complaining of vomiting starting this morning. Also complains of numbness "all over." States she has been taking prednisone for poison oak.

## 2014-10-18 ENCOUNTER — Telehealth: Payer: Self-pay | Admitting: Family

## 2014-10-20 ENCOUNTER — Encounter: Payer: Self-pay | Admitting: Family Medicine

## 2014-10-20 ENCOUNTER — Encounter: Payer: Self-pay | Admitting: *Deleted

## 2014-10-20 ENCOUNTER — Ambulatory Visit (INDEPENDENT_AMBULATORY_CARE_PROVIDER_SITE_OTHER): Payer: BLUE CROSS/BLUE SHIELD | Admitting: Family Medicine

## 2014-10-20 VITALS — BP 145/99 | HR 97 | Temp 97.5°F | Ht 64.0 in | Wt 174.0 lb

## 2014-10-20 DIAGNOSIS — E876 Hypokalemia: Secondary | ICD-10-CM | POA: Diagnosis not present

## 2014-10-20 DIAGNOSIS — M791 Myalgia, unspecified site: Secondary | ICD-10-CM

## 2014-10-20 DIAGNOSIS — R5383 Other fatigue: Secondary | ICD-10-CM

## 2014-10-20 DIAGNOSIS — D72829 Elevated white blood cell count, unspecified: Secondary | ICD-10-CM

## 2014-10-20 DIAGNOSIS — R200 Anesthesia of skin: Secondary | ICD-10-CM

## 2014-10-20 DIAGNOSIS — R202 Paresthesia of skin: Secondary | ICD-10-CM | POA: Diagnosis not present

## 2014-10-20 LAB — POCT WET PREP WITH KOH
KOH Prep POC: POSITIVE
Trichomonas, UA: NEGATIVE

## 2014-10-20 LAB — POCT CBC
Granulocyte percent: 79.5 %G (ref 37–80)
HEMATOCRIT: 44.5 % (ref 37.7–47.9)
Hemoglobin: 14.3 g/dL (ref 12.2–16.2)
Lymph, poc: 2.4 (ref 0.6–3.4)
MCH, POC: 27.6 pg (ref 27–31.2)
MCHC: 32.3 g/dL (ref 31.8–35.4)
MCV: 85.6 fL (ref 80–97)
MPV: 7.5 fL (ref 0–99.8)
POC Granulocyte: 11.4 — AB (ref 2–6.9)
POC LYMPH %: 16.8 % (ref 10–50)
Platelet Count, POC: 357 10*3/uL (ref 142–424)
RBC: 5.19 M/uL (ref 4.04–5.48)
RDW, POC: 15.1 %
WBC: 14.4 10*3/uL — AB (ref 4.6–10.2)

## 2014-10-20 NOTE — Progress Notes (Signed)
Subjective:    Patient ID: Lorraine Mcdaniel, female    DOB: [birth year], 49 y.o.   MRN: [id number]  HPI Patient here today for ER follow up from [facility]. She went there on Friday for numbness of hands, feet, and face. She was also having nausea vomiting and diarrhea and was found to have a low potassium and an elevated white blood cell count. Today she complains of fatigue watery eyes and burning sensation in her eyes. The rash that she was treated for it is greatly improved. She says her mouth is irritated and this is since she vomited on Friday and feels like she vomited up prednisone at that time. There is a white coating on her tongue today which she showed me when I was speaking with her. Her biggest complaint now is just fatigue and weakness and tiredness. The ER visit lab work was reviewed and during that time she had a low potassium and elevated white blood cell count. A dipstick urine was done in the hospital with no leukocytes or RBCs. The patient does complain of some generalized muscle soreness and tenderness.    Patient Active Problem List   Diagnosis Date Noted  . Hyperlipidemia 10/06/2014  . Contact dermatitis 10/06/2014  . Vitamin D deficiency 08/20/2014  . GAD (generalized anxiety disorder) 07/10/2014   Outpatient Encounter Prescriptions as of 10/20/2014  Medication Sig  . acetaminophen (TYLENOL) 500 MG tablet Take 1,000 mg by mouth every 6 (six) hours as needed for moderate pain.  Marland Kitchen ALPRAZolam (XANAX) 0.25 MG tablet Take 1 tablet (0.25 mg total) by mouth 2 (two) times daily as needed for anxiety.  Marland Kitchen atorvastatin (LIPITOR) 10 MG tablet Take 1 tablet (10 mg total) by mouth daily.  Marland Kitchen loratadine (CLARITIN) 10 MG tablet Take 10 mg by mouth daily.  . mometasone (ELOCON) 0.1 % cream Apply 1 application topically daily.  . ondansetron (ZOFRAN) 4 MG tablet Take 1 tablet (4 mg total) by mouth every 6 (six) hours.  . pantoprazole (PROTONIX) 20 MG tablet Take 1 tablet (20 mg total) by  mouth daily.  . ranitidine (ZANTAC) 150 MG capsule Take 150 mg by mouth 2 (two) times daily.  . Vitamin D, Ergocalciferol, (DRISDOL) 50000 UNITS CAPS capsule Take 1 capsule (50,000 Units total) by mouth every 7 (seven) days.  . [DISCONTINUED] diphenoxylate-atropine (LOMOTIL) 2.5-0.025 MG per tablet Take 1 tablet by mouth 4 (four) times daily as needed for diarrhea or loose stools.  . [DISCONTINUED] hydrOXYzine (ATARAX/VISTARIL) 25 MG tablet Take 1 tablet (25 mg total) by mouth every 6 (six) hours as needed for anxiety.  . [DISCONTINUED] predniSONE (DELTASONE) 10 MG tablet TAPER- 1 tab by mouth four times daily for 2 days, 1 tab by mouth three times daily for 2 days, 1 tab by mouth twice a day for 2 days, then 1 tab by mouth daily for 2 days, then stop. (Patient not taking: Reported on 10/17/2014)   No facility-administered encounter medications on file as of 10/20/2014.      Review of Systems  Constitutional: Positive for fatigue.  HENT: Negative.   Eyes: Positive for discharge (watery and burning).  Respiratory: Negative.   Cardiovascular: Negative.   Gastrointestinal: Negative.   Endocrine: Negative.   Genitourinary: Negative.   Musculoskeletal: Negative.   Skin: Negative.   Allergic/Immunologic: Negative.   Neurological: Negative.   Hematological: Negative.   Psychiatric/Behavioral: The patient is nervous/anxious.        Objective:   Physical Exam  Constitutional: She is oriented  to person, place, and time. She appears well-developed and well-nourished. No distress.  HENT:  Head: Normocephalic and atraumatic.  Right Ear: External ear normal.  Left Ear: External ear normal.  Nose: Nose normal.  Mouth/Throat: Oropharynx is clear and moist.  There is a white coating on her tongue .  Eyes: Conjunctivae and EOM are normal. Pupils are equal, round, and reactive to light. Right eye exhibits no discharge. Left eye exhibits no discharge. No scleral icterus.  Neck: Normal range of  motion. Neck supple. No thyromegaly present.  Cardiovascular: Normal rate, regular rhythm and normal heart sounds.   No murmur heard. At 72/m  Pulmonary/Chest: Effort normal and breath sounds normal. No respiratory distress. She has no wheezes. She has no rales. She exhibits no tenderness.  Dry cough without congestion rales or wheezes  Abdominal: Soft. Bowel sounds are normal. She exhibits no mass. There is tenderness. There is no rebound and no guarding.  Slight lower abdominal tenderness  Musculoskeletal: Normal range of motion. She exhibits no edema.  Lymphadenopathy:    She has no cervical adenopathy.  Neurological: She is alert and oriented to person, place, and time.  Skin: Skin is warm and dry. No rash noted.  The confluent erythematous rash that was present at her previous visit has resolved.  Psychiatric: She has a normal mood and affect. Her behavior is normal. Judgment and thought content normal.  Nursing note and vitals reviewed.   BP 145/99 mmHg  Pulse 97  Temp(Src) 97.5 F (36.4 C) (Oral)  Ht $R'5\' 4"'Ed$  (1.626 m)  Wt 174 lb (78.926 kg)  BMI 29.85 kg/m2       Assessment & Plan:  1. Other fatigue -The patient recently had a thyroid profile in April and the TSH was normal. We will recommend rest for another week with lots of fluids. - POCT CBC - BMP8+EGFR - Sedimentation rate - CK - POCT Wet Prep with KOH  2. Numbness and tingling -This is improved today other than the fatigue and we will continue to watch this. - POCT CBC - BMP8+EGFR - Sedimentation rate - CK - POCT Wet Prep with KOH  3. Potassium serum decreased -We will recheck the potassium today and decide if any further treatment is necessary - BMP8+EGFR  4. Muscle soreness -We will check a CK because of taking statin drugs and having muscle soreness  5. Elevated white blood cell count -This elevation may be accounted for from taking the prednisone. -We will recheck it again today.  Patient  Instructions  The patient should remain out of work for another week until she is recovered from all of the events of this past weekend. She should drink plenty of fluids, get rest, avoid milk cheese ice cream and dairy products and caffeine. We will call her with her lab work results as soon as they become available and we will follow-up with any additional lab work as deemed necessary She should also hold her statin drug until lab work is returned   Arrie Senate MD

## 2014-10-20 NOTE — Telephone Encounter (Signed)
This has been resolved. Patient was seen in the office on 10/20/14.

## 2014-10-20 NOTE — Patient Instructions (Addendum)
The patient should remain out of work for another week until she is recovered from all of the events of this past weekend. She should drink plenty of fluids, get rest, avoid milk cheese ice cream and dairy products and caffeine. We will call her with her lab work results as soon as they become available and we will follow-up with any additional lab work as deemed necessary She should also hold her statin drug until lab work is returned

## 2014-10-21 ENCOUNTER — Telehealth: Payer: Self-pay | Admitting: *Deleted

## 2014-10-21 ENCOUNTER — Telehealth: Payer: Self-pay | Admitting: Family Medicine

## 2014-10-21 ENCOUNTER — Other Ambulatory Visit: Payer: Self-pay | Admitting: *Deleted

## 2014-10-21 LAB — BMP8+EGFR
BUN / CREAT RATIO: 9 (ref 9–23)
BUN: 7 mg/dL (ref 6–24)
CHLORIDE: 99 mmol/L (ref 97–108)
CO2: 22 mmol/L (ref 18–29)
Calcium: 10 mg/dL (ref 8.7–10.2)
Creatinine, Ser: 0.76 mg/dL (ref 0.57–1.00)
GFR calc Af Amer: 107 mL/min/{1.73_m2} (ref >59)
GFR calc non Af Amer: 92 mL/min/{1.73_m2} (ref >59)
GLUCOSE: 90 mg/dL (ref 65–99)
Potassium: 5 mmol/L (ref 3.5–5.2)
Sodium: 138 mmol/L (ref 134–144)

## 2014-10-21 LAB — SEDIMENTATION RATE: Sed Rate: 10 mm/hr (ref 0–32)

## 2014-10-21 LAB — CK: Total CK: 23 U/L — ABNORMAL LOW (ref 24–173)

## 2014-10-21 NOTE — Telephone Encounter (Signed)
-----   Message from Magdalene RiverJamie H Bullins, LPN sent at [id number]/2016  3:18 PM EDT -----   ----- Message -----    From: Ernestina Pennaonald W Moore, MD    Sent: 10/20/2014   2:01 PM      To: Earle GellJamie H Bullins, LPN  KOH prep was positive for yeast.----- please call a prescription in for Mycostatin oral suspension swish and swallow 1 teaspoon 4 times daily after meals and at bedtime #200 mL The CBC has a white blood cell count that is decreased from 3 days ago and not too far from normal. The hemoglobin is good at 14.3 and the platelet count is adequate.

## 2014-10-21 NOTE — Telephone Encounter (Signed)
-----   Message from Ernestina Pennaonald W Moore, MD sent at 10/21/2014  7:26 AM EDT ----- The blood sugar is good at 90. The creatinine, the most important kidney function test remains within normal limits. All of the electrolytes including potassium are good. On the previous reading, 4 days ago the potassium was low and is now normal. The sedimentation rate and important tests for inflammation was within normal limits The CK was minimally decreased. This is a muscle enzyme test and being decreased is not a problem. Hopefully, resting this week and getting plenty of fluids will help her get her energy level back to normal so that she will be able to return to work next week.

## 2014-10-22 NOTE — Telephone Encounter (Signed)
Aware of all results. 

## 2014-10-23 DIAGNOSIS — Z029 Encounter for administrative examinations, unspecified: Secondary | ICD-10-CM

## 2014-10-31 ENCOUNTER — Encounter: Payer: Self-pay | Admitting: Family Medicine

## 2014-11-04 ENCOUNTER — Telehealth: Payer: Self-pay | Admitting: Family Medicine

## 2014-11-04 NOTE — Telephone Encounter (Signed)
lmovm that last work note sent.

## 2014-12-26 ENCOUNTER — Ambulatory Visit (INDEPENDENT_AMBULATORY_CARE_PROVIDER_SITE_OTHER): Payer: BLUE CROSS/BLUE SHIELD | Admitting: Family Medicine

## 2014-12-26 ENCOUNTER — Encounter: Payer: Self-pay | Admitting: Family Medicine

## 2014-12-26 VITALS — BP 118/83 | HR 81 | Temp 98.3°F | Ht 64.0 in | Wt 174.8 lb

## 2014-12-26 DIAGNOSIS — L700 Acne vulgaris: Secondary | ICD-10-CM | POA: Insufficient documentation

## 2014-12-26 DIAGNOSIS — L821 Other seborrheic keratosis: Secondary | ICD-10-CM

## 2014-12-26 NOTE — Assessment & Plan Note (Signed)
Patient has what appears to be an early open comedone on her left temple and wanted to get it checked to make sure it wasn't anything malignant. There is a possibility it could be an early blue nevus we will have her monitor and make sure that he does not get any larger.

## 2014-12-26 NOTE — Progress Notes (Signed)
BP 118/83 mmHg  Pulse 81  Temp(Src) 98.3 F (36.8 C) (Oral)  Ht  (1.626 m)  Wt 174 lb 12.8 oz (79.289 kg)  BMI 29.99 kg/m2   Subjective:    Patient ID: Lorraine Mcdaniel, female    DOB: 30-Oct-1965, 49 y.o.   MRN: [id number]  HPI: Lorraine Mcdaniel is a 49 y.o. female presenting on 12/26/2014 for Nevus   HPI Skin lesions Patient % because she has 2 skin lesions that are new or to her over the past month and a half to 2 months and she is concerned about them. One is on her right back and the other is on her left temple. They're both little itchy but otherwise nontender, nor swelling. She also had a third lesion on her left back just above her bra line which was consistent with a nevus that has been there for some time for her.  Relevant past medical, surgical, family and social history reviewed and updated as indicated. Interim medical history since our last visit reviewed. Allergies and medications reviewed and updated.  Review of Systems  Constitutional: Negative for fever and chills.  HENT: Negative for congestion, ear discharge and ear pain.   Eyes: Negative for redness and visual disturbance.  Respiratory: Negative for chest tightness and shortness of breath.   Cardiovascular: Negative for chest pain and leg swelling.  Genitourinary: Negative for dysuria and difficulty urinating.  Musculoskeletal: Negative for back pain and gait problem.  Skin: Negative for rash.       2 small lesions that she wants to check  Neurological: Negative for light-headedness and headaches.  Psychiatric/Behavioral: Negative for behavioral problems and agitation.  All other systems reviewed and are negative.   Per HPI unless specifically indicated above     Medication List       This list is accurate as of: 12/26/14  1:35 PM.  Always use your most recent med list.               acetaminophen 500 MG tablet  Commonly known as:  TYLENOL  Take 1,000 mg by mouth every 6 (six) hours as needed for  moderate pain.     ALPRAZolam 0.25 MG tablet  Commonly known as:  XANAX  Take 1 tablet (0.25 mg total) by mouth 2 (two) times daily as needed for anxiety.     atorvastatin 10 MG tablet  Commonly known as:  LIPITOR  Take 1 tablet (10 mg total) by mouth daily.     loratadine 10 MG tablet  Commonly known as:  CLARITIN  Take 10 mg by mouth daily.     mometasone 0.1 % cream  Commonly known as:  ELOCON  Apply 1 application topically daily.     ondansetron 4 MG tablet  Commonly known as:  ZOFRAN  Take 1 tablet (4 mg total) by mouth every 6 (six) hours.     pantoprazole 20 MG tablet  Commonly known as:  PROTONIX  Take 1 tablet (20 mg total) by mouth daily.     ranitidine 150 MG capsule  Commonly known as:  ZANTAC  Take 150 mg by mouth 2 (two) times daily.     Vitamin D (Ergocalciferol) 50000 UNITS Caps capsule  Commonly known as:  DRISDOL  Take 1 capsule (50,000 Units total) by mouth every 7 (seven) days.           Objective:    BP 118/83 mmHg  Pulse 81  Temp(Src) 98.3 F (36.8 C) (Oral)  Ht '5\' 4"'$  (1.626 m)  Wt 174 lb 12.8 oz (79.289 kg)  BMI 29.99 kg/m2  Wt Readings from Last 3 Encounters:  12/26/14 174 lb 12.8 oz (79.289 kg)  10/20/14 174 lb (78.926 kg)  10/17/14 174 lb (78.926 kg)    Physical Exam  Constitutional: She is oriented to person, place, and time. She appears well-developed and well-nourished. No distress.  Eyes: Conjunctivae and EOM are normal. Pupils are equal, round, and reactive to light.  Cardiovascular: Normal rate and regular rhythm.   No murmur heard. Pulmonary/Chest: Effort normal and breath sounds normal. No respiratory distress. She has no wheezes.  Musculoskeletal: Normal range of motion. She exhibits no edema or tenderness.  Neurological: She is alert and oriented to person, place, and time. Coordination normal.  Skin: Skin is warm and dry. No rash noted. She is not diaphoretic.     Psychiatric: She has a normal mood and affect. Her  behavior is normal.  Vitals reviewed.   Results for orders placed or performed in visit on 10/20/14  [facility]  Result Value Ref Range   Glucose 90 65 - 99 mg/dL   BUN 7 6 - 24 mg/dL   Creatinine, Ser 0.76 0.57 - 1.00 mg/dL   GFR calc non Af Amer 92 >59 mL/min/1.73   GFR calc Af Amer 107 >59 mL/min/1.73   BUN/Creatinine Ratio 9 9 - 23   Sodium 138 134 - 144 mmol/L   Potassium 5.0 3.5 - 5.2 mmol/L   Chloride 99 97 - 108 mmol/L   CO2 22 18 - 29 mmol/L   Calcium 10.0 8.7 - 10.2 mg/dL  Sedimentation rate  Result Value Ref Range   Sed Rate 10 0 - 32 mm/hr  CK  Result Value Ref Range   Total CK 23 (L) 24 - 173 U/L  POCT CBC  Result Value Ref Range   WBC 14.4 (A) 4.6 - 10.2 K/uL   Lymph, poc 2.4 0.6 - 3.4   POC LYMPH PERCENT 16.8 10 - 50 %L   POC Granulocyte 11.4 (A) 2 - 6.9   Granulocyte percent 79.5 37 - 80 %G   RBC 5.19 4.04 - 5.48 M/uL   Hemoglobin 14.3 12.2 - 16.2 g/dL   HCT, POC 44.5 37.7 - 47.9 %   MCV 85.6 80 - 97 fL   MCH, POC 27.6 27 - 31.2 pg   MCHC 32.3 31.8 - 35.4 g/dL   RDW, POC 15.1 %   Platelet Count, POC 357 142 - 424 K/uL   MPV 7.5 0 - 99.8 fL  POCT Wet Prep with KOH  Result Value Ref Range   Trichomonas, UA Negative    Clue Cells Wet Prep HPF POC mod    Epithelial Wet Prep HPF POC Moderate Few, Moderate, Many   Yeast Wet Prep HPF POC none    Bacteria Wet Prep HPF POC Moderate (A) Few   RBC Wet Prep HPF POC rare    WBC Wet Prep HPF POC rare    KOH Prep POC Positive       Assessment & Plan:   Problem List Items Addressed This Visit      Musculoskeletal and Integument   Seborrheic keratoses - Primary    Patient has an SK on her right back under her bra. She does want to get it checked out and make sure it was nothing malignant. She does not want it removed because it is not irritated or causing her any problems.  Black head    Patient has what appears to be an early open comedone on her left temple and wanted to get it checked to make sure it  wasn't anything malignant. There is a possibility it could be an early blue nevus we will have her monitor and make sure that he does not get any larger.          Follow up plan: No Follow-up on file.  Caryl Pina, MD [facility] 12/26/2014, 1:35 PM

## 2014-12-26 NOTE — Assessment & Plan Note (Signed)
Patient has an SK on her right back under her bra. She does want to get it checked out and make sure it was nothing malignant. She does not want it removed because it is not irritated or causing her any problems.

## 2015-02-02 ENCOUNTER — Encounter: Payer: Self-pay | Admitting: Family Medicine

## 2015-02-02 ENCOUNTER — Ambulatory Visit (INDEPENDENT_AMBULATORY_CARE_PROVIDER_SITE_OTHER): Payer: BLUE CROSS/BLUE SHIELD | Admitting: Family Medicine

## 2015-02-02 VITALS — BP 138/86 | HR 87 | Temp 97.1°F | Ht 64.0 in | Wt 173.6 lb

## 2015-02-02 DIAGNOSIS — B029 Zoster without complications: Secondary | ICD-10-CM

## 2015-02-02 MED ORDER — CLOTRIMAZOLE 10 MG MT TROC: 10.0000 mg | Freq: Every day | OROMUCOSAL | Status: DC

## 2015-02-02 MED ORDER — VALACYCLOVIR HCL 1 G PO TABS: 1000.0000 mg | ORAL_TABLET | Freq: Three times a day (TID) | ORAL | Status: DC

## 2015-02-02 MED ORDER — PIMECROLIMUS 1 % EX CREA: TOPICAL_CREAM | Freq: Two times a day (BID) | CUTANEOUS | Status: DC

## 2015-02-02 NOTE — Progress Notes (Signed)
Subjective:  Patient ID: Lorraine Mcdaniel, female    DOB: July 04, 1965  Age: 49 y.o. MRN: [id number]  CC: Rash   HPI Lorraine Mcdaniel presents for itching and burning and redness on the left side of her face onset 4 days ago. Increasing in severity of symptoms and increasing in area of the face affected. It is limited to the left side. Patient is worried about shingles. She has not noted any esters or vesicles. There has been no fever. The pain has been moderate and is affecting her ability to go about her usual routine due to the discomfort. She has not had any paresis of the face. She does have some remaining yeast in the throat from recent antibiotic. Mouth is burning.  History Lorraine Mcdaniel has a past medical history of Anxiety; Hyperlipidemia; and Vitamin D deficiency.   She has past surgical history that includes Abdominal hysterectomy (1991).   Her family history includes Arthritis in her mother; Heart disease in her father and mother; Hypertension in her father; Stroke in her father.She reports that she has been smoking Cigarettes.  She started smoking about 44 years ago. She has been smoking about 0.75 packs per day. She does not have any smokeless tobacco history on file. She reports that she drinks alcohol. She reports that she does not use illicit drugs.  Outpatient Prescriptions Prior to Visit  Medication Sig Dispense Refill  . acetaminophen (TYLENOL) 500 MG tablet Take 1,000 mg by mouth every 6 (six) hours as needed for moderate pain.    Marland Kitchen ALPRAZolam (XANAX) 0.25 MG tablet Take 1 tablet (0.25 mg total) by mouth 2 (two) times daily as needed for anxiety. 30 tablet 3  . atorvastatin (LIPITOR) 10 MG tablet Take 1 tablet (10 mg total) by mouth daily. 90 tablet 1  . loratadine (CLARITIN) 10 MG tablet Take 10 mg by mouth daily.    . Vitamin D, Ergocalciferol, (DRISDOL) 50000 UNITS CAPS capsule Take 1 capsule (50,000 Units total) by mouth every 7 (seven) days. 12 capsule 3  . mometasone (ELOCON) 0.1 %  cream Apply 1 application topically daily. (Patient not taking: Reported on 02/02/2015) 45 g 0  . ondansetron (ZOFRAN) 4 MG tablet Take 1 tablet (4 mg total) by mouth every 6 (six) hours. (Patient not taking: Reported on 02/02/2015) 12 tablet 0  . pantoprazole (PROTONIX) 20 MG tablet Take 1 tablet (20 mg total) by mouth daily. (Patient not taking: Reported on 12/26/2014) 30 tablet 0  . ranitidine (ZANTAC) 150 MG capsule Take 150 mg by mouth 2 (two) times daily.     No facility-administered medications prior to visit.    ROS Review of Systems  Constitutional: Negative for fever, chills, diaphoresis, appetite change, fatigue and unexpected weight change.  HENT: Negative for congestion, ear pain, hearing loss, postnasal drip, rhinorrhea, sneezing, sore throat and trouble swallowing.   Eyes: Negative for pain.  Respiratory: Negative for cough, chest tightness and shortness of breath.   Cardiovascular: Negative for chest pain and palpitations.  Gastrointestinal: Negative for nausea, vomiting, abdominal pain, diarrhea and constipation.  Genitourinary: Negative for dysuria, frequency and menstrual problem.  Musculoskeletal: Negative for joint swelling and arthralgias.  Skin: Negative for rash.  Neurological: Negative for dizziness, weakness, numbness and headaches.  Psychiatric/Behavioral: Negative for dysphoric mood and agitation.    Objective:  BP 138/86 mmHg  Pulse 87  Temp(Src) 97.1 F (36.2 C) (Oral)  Ht  (1.626 m)  Wt 173 lb 9.6 oz (78.744 kg)  BMI 29.78 kg/m2  BP Readings from Last 3 Encounters:  02/02/15 138/86  12/26/14 118/83  10/20/14 145/99    Wt Readings from Last 3 Encounters:  02/02/15 173 lb 9.6 oz (78.744 kg)  12/26/14 174 lb 12.8 oz (79.289 kg)  10/20/14 174 lb (78.926 kg)     Physical Exam  Constitutional: She is oriented to person, place, and time. She appears well-developed and well-nourished. No distress.  HENT:  Head: Normocephalic and atraumatic.    Right Ear: External ear normal.  Left Ear: External ear normal.  Nose: Nose normal.  Mouth/Throat: Oropharynx is clear and moist.  Eyes: Conjunctivae and EOM are normal. Pupils are equal, round, and reactive to light.  Neck: Normal range of motion. Neck supple. No thyromegaly present.  Cardiovascular: Normal rate, regular rhythm and normal heart sounds.   No murmur heard. Pulmonary/Chest: Effort normal and breath sounds normal. No respiratory distress. She has no wheezes. She has no rales.  Abdominal: Soft. Bowel sounds are normal. She exhibits no distension. There is no tenderness.  Lymphadenopathy:    She has no cervical adenopathy.  Neurological: She is alert and oriented to person, place, and time. She has normal reflexes.  Skin: Skin is warm and dry.  Psychiatric: She has a normal mood and affect. Her behavior is normal. Judgment and thought content normal.    No results found for: HGBA1C  Lab Results  Component Value Date   WBC 14.4* 10/20/2014   HGB 14.3 10/20/2014   HCT 44.5 10/20/2014   PLT 351 10/17/2014   GLUCOSE 90 10/20/2014   CHOL 162 08/15/2014   TRIG 240* 08/15/2014   HDL 36* 08/15/2014   LDLCALC 78 08/15/2014   ALT 18 10/17/2014   AST 18 10/17/2014   NA 138 10/20/2014   K 5.0 10/20/2014   CL 99 10/20/2014   CREATININE 0.76 10/20/2014   BUN 7 10/20/2014   CO2 22 10/20/2014   TSH 1.460 08/15/2014   INR 0.96 07/04/2014    No results found.  Assessment & Plan:   Lorraine Mcdaniel was seen today for rash.  Diagnoses and all orders for this visit:  Herpes zoster  Other orders -     pimecrolimus (ELIDEL) 1 % cream; Apply topically 2 (two) times daily. -     clotrimazole (MYCELEX) 10 MG troche; Take 1 tablet (10 mg total) by mouth 5 (five) times daily. -     valACYclovir (VALTREX) 1000 MG tablet; Take 1 tablet (1,000 mg total) by mouth 3 (three) times daily.  I have discontinued Lorraine Mcdaniel's mometasone, ranitidine, ondansetron, and pantoprazole. I am also  having her start on pimecrolimus, clotrimazole, and valACYclovir. Additionally, I am having her maintain her ALPRAZolam, atorvastatin, Vitamin D (Ergocalciferol), loratadine, and acetaminophen.  Meds ordered this encounter  Medications  . pimecrolimus (ELIDEL) 1 % cream    Sig: Apply topically 2 (two) times daily.    Dispense:  30 g    Refill:  0  . clotrimazole (MYCELEX) 10 MG troche    Sig: Take 1 tablet (10 mg total) by mouth 5 (five) times daily.    Dispense:  35 tablet    Refill:  0  . valACYclovir (VALTREX) 1000 MG tablet    Sig: Take 1 tablet (1,000 mg total) by mouth 3 (three) times daily.    Dispense:  21 tablet    Refill:  0     Follow-up: No Follow-up on file.  Mechele Claude, M.D.

## 2015-02-04 ENCOUNTER — Encounter: Payer: BLUE CROSS/BLUE SHIELD | Admitting: Family Medicine

## 2015-07-28 DIAGNOSIS — E785 Hyperlipidemia, unspecified: Secondary | ICD-10-CM | POA: Diagnosis not present

## 2015-07-28 DIAGNOSIS — Z79899 Other long term (current) drug therapy: Secondary | ICD-10-CM | POA: Diagnosis not present

## 2015-07-28 DIAGNOSIS — E559 Vitamin D deficiency, unspecified: Secondary | ICD-10-CM | POA: Diagnosis not present

## 2015-07-28 DIAGNOSIS — Z139 Encounter for screening, unspecified: Secondary | ICD-10-CM | POA: Diagnosis not present

## 2015-08-06 DIAGNOSIS — E559 Vitamin D deficiency, unspecified: Secondary | ICD-10-CM | POA: Diagnosis not present

## 2015-08-06 DIAGNOSIS — B370 Candidal stomatitis: Secondary | ICD-10-CM | POA: Diagnosis not present

## 2015-08-06 DIAGNOSIS — E785 Hyperlipidemia, unspecified: Secondary | ICD-10-CM | POA: Diagnosis not present

## 2015-08-19 ENCOUNTER — Encounter: Payer: Self-pay | Admitting: Family Medicine

## 2015-08-19 ENCOUNTER — Ambulatory Visit (INDEPENDENT_AMBULATORY_CARE_PROVIDER_SITE_OTHER): Payer: BLUE CROSS/BLUE SHIELD | Admitting: Family Medicine

## 2015-08-19 VITALS — BP 137/85 | HR 71 | Temp 98.1°F | Ht 64.0 in | Wt 175.6 lb

## 2015-08-19 DIAGNOSIS — R21 Rash and other nonspecific skin eruption: Secondary | ICD-10-CM | POA: Diagnosis not present

## 2015-08-19 DIAGNOSIS — F411 Generalized anxiety disorder: Secondary | ICD-10-CM

## 2015-08-19 MED ORDER — VALACYCLOVIR HCL 1 G PO TABS: 1000.0000 mg | ORAL_TABLET | Freq: Three times a day (TID) | ORAL | Status: DC

## 2015-08-19 MED ORDER — ALPRAZOLAM 0.25 MG PO TABS: 0.2500 mg | ORAL_TABLET | Freq: Two times a day (BID) | ORAL | Status: DC | PRN

## 2015-08-19 MED ORDER — PIMECROLIMUS 1 % EX CREA: TOPICAL_CREAM | Freq: Two times a day (BID) | CUTANEOUS | Status: DC

## 2015-08-19 NOTE — Patient Instructions (Signed)
Great to meet you!  Start valtrex right away  I have refilled xanax as well  You are due for a physical I would recommend coming in the next month or two to get that.

## 2015-08-19 NOTE — Progress Notes (Signed)
   HPI  Patient presents today here with concern for developing rash.  Patient explains that she's had this issue before. Previously was treated as herpes zoster lesions with Valtrex and Elidel cream and resolved very well. Last episode it got very severe previous to starting treatment and she wanted to start treatment earlier than this.  She describes 4-5 days of numbness and tingling type pain with erythema slightly developing on her bilateral lower jawline, cheeks, and maxillary area  She has no other symptoms including no fevers, chills, sweats, cough, shortness of breath, chest pain.  She has never had chickenpox but has responded well to valtrex previously.  PMH: Smoking status noted ROS: Per HPI  Objective: BP 137/85 mmHg  Pulse 71  Temp(Src) 98.1 F (36.7 C) (Oral)  Ht 5\' 4"  (1.626 m)  Wt 175 lb 9.6 oz (79.652 kg)  BMI 30.13 kg/m2 Gen: NAD, alert, cooperative with exam HEENT: NCAT CV: RRR, good S1/S2, no murmur Resp: CTABL, no wheezes, non-labored Ext: No edema, warm Neuro: Alert and oriented, No gross deficits  Skin: Erythema with very fine papules bilateral lower jawline upper neck, right bilateral cheeks and bilateral maxillary areas No areas of fluctuance, induration, or swelling  Assessment and plan:  # Developing rash Agree with previous assessments in the way that she has responded previously, these are likely herpetic lesions Restart Valtrex Refilled elidel, would recommend 2.5% hydrocortisone if not covered Return to clinic with any concerns, worsening symptoms, or failure to improve  Headaches refill 1 month, discussed that she would likely need to see her primary care provider for future refills.  Meds ordered this encounter  Medications  . pimecrolimus (ELIDEL) 1 % cream    Sig: Apply topically 2 (two) times daily.    Dispense:  30 g    Refill:  0  . valACYclovir (VALTREX) 1000 MG tablet    Sig: Take 1 tablet (1,000 mg total) by mouth 3  (three) times daily.    Dispense:  21 tablet    Refill:  0  . ALPRAZolam (XANAX) 0.25 MG tablet    Sig: Take 1 tablet (0.25 mg total) by mouth 2 (two) times daily as needed for anxiety.    Dispense:  30 tablet    Refill:  0    Murtis SinkSam Alyviah Crandle, MD [facility] 08/19/2015, 5:42 PM

## 2015-08-20 ENCOUNTER — Encounter: Payer: Self-pay | Admitting: Family Medicine

## 2015-08-20 ENCOUNTER — Telehealth: Payer: Self-pay

## 2015-08-20 NOTE — Telephone Encounter (Signed)
Insurance prior authorized Elidel through 08/19/2025

## 2015-08-28 ENCOUNTER — Telehealth: Payer: Self-pay | Admitting: Family Medicine

## 2015-08-28 NOTE — Telephone Encounter (Signed)
Elidel and valtrex were added to patient's allergy list for precaution.

## 2015-08-28 NOTE — Telephone Encounter (Signed)
Please review and advise.

## 2015-08-28 NOTE — Telephone Encounter (Signed)
That is okay.  Her lesions are suspected to be herpetic lesions, these would self resolve without treatment, however there duration will be longer.  If she has any worsening symptoms or other concerns he is welcome to call or come back any time.  Murtis SinkSam Hebe Merriwether, MD [facility] 08/28/2015, 10:15 AM

## 2015-08-28 NOTE — Telephone Encounter (Signed)
Patient unsure if valtrex and elidel caused facial and lip swelling.  She stopped medications and will take allergy medications to relieve symptoms.  She may start back with just the cream to determine if it caused the swelling.  If no reaction, she plans to stop the cream and begin the valtrex to hopefully rule out one or both.

## 2015-08-29 DIAGNOSIS — D259 Leiomyoma of uterus, unspecified: Secondary | ICD-10-CM | POA: Diagnosis not present

## 2015-09-01 DIAGNOSIS — B350 Tinea barbae and tinea capitis: Secondary | ICD-10-CM | POA: Diagnosis not present

## 2015-09-01 DIAGNOSIS — D259 Leiomyoma of uterus, unspecified: Secondary | ICD-10-CM | POA: Diagnosis not present

## 2015-09-01 DIAGNOSIS — R1032 Left lower quadrant pain: Secondary | ICD-10-CM | POA: Diagnosis not present

## 2015-09-24 DIAGNOSIS — Z716 Tobacco abuse counseling: Secondary | ICD-10-CM | POA: Diagnosis not present

## 2015-09-24 DIAGNOSIS — F17210 Nicotine dependence, cigarettes, uncomplicated: Secondary | ICD-10-CM | POA: Diagnosis not present

## 2015-10-23 DIAGNOSIS — N3001 Acute cystitis with hematuria: Secondary | ICD-10-CM | POA: Diagnosis not present

## 2015-10-23 DIAGNOSIS — N390 Urinary tract infection, site not specified: Secondary | ICD-10-CM | POA: Diagnosis not present

## 2015-10-23 DIAGNOSIS — R0789 Other chest pain: Secondary | ICD-10-CM | POA: Diagnosis not present

## 2015-10-23 DIAGNOSIS — Z79899 Other long term (current) drug therapy: Secondary | ICD-10-CM | POA: Diagnosis not present

## 2015-11-19 DIAGNOSIS — E785 Hyperlipidemia, unspecified: Secondary | ICD-10-CM | POA: Diagnosis not present

## 2015-11-19 DIAGNOSIS — E559 Vitamin D deficiency, unspecified: Secondary | ICD-10-CM | POA: Diagnosis not present

## 2015-11-19 DIAGNOSIS — F17210 Nicotine dependence, cigarettes, uncomplicated: Secondary | ICD-10-CM | POA: Diagnosis not present

## 2015-11-19 DIAGNOSIS — Z716 Tobacco abuse counseling: Secondary | ICD-10-CM | POA: Diagnosis not present

## 2016-01-01 DIAGNOSIS — L237 Allergic contact dermatitis due to plants, except food: Secondary | ICD-10-CM | POA: Diagnosis not present

## 2016-01-14 DIAGNOSIS — H402232 Chronic angle-closure glaucoma, bilateral, moderate stage: Secondary | ICD-10-CM | POA: Diagnosis not present

## 2016-01-19 DIAGNOSIS — E785 Hyperlipidemia, unspecified: Secondary | ICD-10-CM | POA: Diagnosis not present

## 2016-01-19 DIAGNOSIS — E559 Vitamin D deficiency, unspecified: Secondary | ICD-10-CM | POA: Diagnosis not present

## 2016-01-19 DIAGNOSIS — Z79899 Other long term (current) drug therapy: Secondary | ICD-10-CM | POA: Diagnosis not present

## 2016-01-19 DIAGNOSIS — Z139 Encounter for screening, unspecified: Secondary | ICD-10-CM | POA: Diagnosis not present

## 2016-01-28 DIAGNOSIS — R7301 Impaired fasting glucose: Secondary | ICD-10-CM | POA: Diagnosis not present

## 2016-01-28 DIAGNOSIS — E559 Vitamin D deficiency, unspecified: Secondary | ICD-10-CM | POA: Diagnosis not present

## 2016-01-28 DIAGNOSIS — E785 Hyperlipidemia, unspecified: Secondary | ICD-10-CM | POA: Diagnosis not present

## 2016-02-16 ENCOUNTER — Ambulatory Visit (INDEPENDENT_AMBULATORY_CARE_PROVIDER_SITE_OTHER): Payer: BLUE CROSS/BLUE SHIELD | Admitting: Pediatrics

## 2016-02-16 ENCOUNTER — Encounter: Payer: Self-pay | Admitting: Pediatrics

## 2016-02-16 VITALS — BP 133/76 | HR 78 | Temp 98.3°F | Ht 64.0 in | Wt 189.6 lb

## 2016-02-16 DIAGNOSIS — E559 Vitamin D deficiency, unspecified: Secondary | ICD-10-CM | POA: Diagnosis not present

## 2016-02-16 DIAGNOSIS — E785 Hyperlipidemia, unspecified: Secondary | ICD-10-CM

## 2016-02-16 DIAGNOSIS — Z780 Asymptomatic menopausal state: Secondary | ICD-10-CM | POA: Diagnosis not present

## 2016-02-16 DIAGNOSIS — Z1239 Encounter for other screening for malignant neoplasm of breast: Secondary | ICD-10-CM

## 2016-02-16 DIAGNOSIS — Z1231 Encounter for screening mammogram for malignant neoplasm of breast: Secondary | ICD-10-CM

## 2016-02-16 DIAGNOSIS — K219 Gastro-esophageal reflux disease without esophagitis: Secondary | ICD-10-CM | POA: Diagnosis not present

## 2016-02-16 MED ORDER — CITALOPRAM HYDROBROMIDE 10 MG PO TABS: 10.0000 mg | ORAL_TABLET | Freq: Every day | ORAL | 3 refills | Status: DC

## 2016-02-16 NOTE — Progress Notes (Signed)
  Subjective:   Patient ID: Lorraine Mcdaniel, female    DOB: 1965-11-17, 50 y.o.   MRN: [id number] CC: Breast Pain  HPI: Lorraine Mcdaniel is a 50 y.o. female presenting for Breast Pain  Dull constant ache in breasts for past two days Hysterectomy in 1991 Has come and gone for years  Irritable more recently Has had hot flashes at times, face goes red, starts sweating Irritability has been getting worse Has been on zoloft in the distant past, got more irritable Hot flashes improving but still worse at night  Also with reflux symptoms, ongoing past few years Better when she drinks milk Worse when she drinks sodas or sugary drinks Doesn't want to start on medication now  Sister with breast cancer in mid-20s  Relevant past medical, surgical, family and social history reviewed. Allergies and medications reviewed and updated. History  Smoking Status  . Former Smoker  . Packs/day: 0.75  . Types: Cigarettes  . Start date: 11/19/1970  . Quit date: 12/29/2015  Smokeless Tobacco  . Never Used   ROS: Per HPI   Objective:    BP 133/76   Pulse 78   Temp 98.3 F (36.8 C) (Oral)   Ht 5\' 4"  (1.626 m)   Wt 189 lb 9.6 oz (86 kg)   BMI 32.54 kg/m   Wt Readings from Last 3 Encounters:  02/16/16 189 lb 9.6 oz (86 kg)  08/19/15 175 lb 9.6 oz (79.7 kg)  02/02/15 173 lb 9.6 oz (78.7 kg)    Gen: NAD, alert, cooperative with exam, NCAT EYES: EOMI, no conjunctival injection, or no icterus ENT:  OP without erythema CV: NRRR, normal S1/S2, no murmur, distal pulses 2+ b/l Resp: CTABL, no wheezes, normal WOB Ext: No edema, warm Neuro: Alert and oriented MSK: normal muscle bulk Breast: normal exam b/l, no lumps, no axillary LAD, normal nipples b/l  Assessment & Plan:  [name] was seen today for breast aching b/l. Normal exam. Due for mammogram.  Diagnoses and all orders for this visit:  Menopause Start low dose citalopram for hot flashes, irritability -     citalopram (CELEXA) 10 MG tablet; Take  1 tablet (10 mg total) by mouth daily.  Breast cancer screening -     MM Digital Screening; Future  Gastroesophageal reflux disease, esophagitis presence not specified Wants to try diet changes first before medication  Hyperlipidemia, unspecified hyperlipidemia type Recently started on atorvastatin, no symptoms  Vitamin D deficiency Cont replacement  Follow up plan: Return in about 3 months (around 05/18/2016) for med follow up. Rex Krasarol Jandy Brackens, MD [facility]

## 2016-02-18 DIAGNOSIS — H40013 Open angle with borderline findings, low risk, bilateral: Secondary | ICD-10-CM | POA: Diagnosis not present

## 2016-02-22 DIAGNOSIS — Z029 Encounter for administrative examinations, unspecified: Secondary | ICD-10-CM

## 2016-03-07 DIAGNOSIS — F17210 Nicotine dependence, cigarettes, uncomplicated: Secondary | ICD-10-CM | POA: Diagnosis not present

## 2016-03-07 DIAGNOSIS — S61452A Open bite of left hand, initial encounter: Secondary | ICD-10-CM | POA: Diagnosis not present

## 2016-03-07 DIAGNOSIS — M7989 Other specified soft tissue disorders: Secondary | ICD-10-CM | POA: Diagnosis not present

## 2016-03-07 DIAGNOSIS — L03114 Cellulitis of left upper limb: Secondary | ICD-10-CM | POA: Diagnosis not present

## 2016-03-07 DIAGNOSIS — W540XXA Bitten by dog, initial encounter: Secondary | ICD-10-CM | POA: Diagnosis not present

## 2016-03-07 DIAGNOSIS — Y92019 Unspecified place in single-family (private) house as the place of occurrence of the external cause: Secondary | ICD-10-CM | POA: Diagnosis not present

## 2016-03-07 DIAGNOSIS — Z716 Tobacco abuse counseling: Secondary | ICD-10-CM | POA: Diagnosis not present

## 2016-03-07 DIAGNOSIS — L089 Local infection of the skin and subcutaneous tissue, unspecified: Secondary | ICD-10-CM | POA: Diagnosis not present

## 2016-03-07 DIAGNOSIS — R739 Hyperglycemia, unspecified: Secondary | ICD-10-CM | POA: Diagnosis not present

## 2016-03-08 DIAGNOSIS — L03114 Cellulitis of left upper limb: Secondary | ICD-10-CM | POA: Diagnosis not present

## 2016-03-08 DIAGNOSIS — S61452A Open bite of left hand, initial encounter: Secondary | ICD-10-CM | POA: Diagnosis not present

## 2016-03-09 DIAGNOSIS — S61452A Open bite of left hand, initial encounter: Secondary | ICD-10-CM | POA: Diagnosis not present

## 2016-03-09 DIAGNOSIS — L03114 Cellulitis of left upper limb: Secondary | ICD-10-CM | POA: Diagnosis not present

## 2016-03-24 DIAGNOSIS — E669 Obesity, unspecified: Secondary | ICD-10-CM | POA: Diagnosis not present

## 2016-03-24 DIAGNOSIS — Z6832 Body mass index (BMI) 32.0-32.9, adult: Secondary | ICD-10-CM | POA: Diagnosis not present

## 2016-04-05 DIAGNOSIS — Z6832 Body mass index (BMI) 32.0-32.9, adult: Secondary | ICD-10-CM | POA: Diagnosis not present

## 2016-04-05 DIAGNOSIS — E669 Obesity, unspecified: Secondary | ICD-10-CM | POA: Diagnosis not present

## 2016-04-12 DIAGNOSIS — Z6831 Body mass index (BMI) 31.0-31.9, adult: Secondary | ICD-10-CM | POA: Diagnosis not present

## 2016-04-12 DIAGNOSIS — E669 Obesity, unspecified: Secondary | ICD-10-CM | POA: Diagnosis not present

## 2016-05-10 DIAGNOSIS — Z6831 Body mass index (BMI) 31.0-31.9, adult: Secondary | ICD-10-CM | POA: Diagnosis not present

## 2016-05-10 DIAGNOSIS — E669 Obesity, unspecified: Secondary | ICD-10-CM | POA: Diagnosis not present

## 2016-05-10 DIAGNOSIS — E785 Hyperlipidemia, unspecified: Secondary | ICD-10-CM | POA: Diagnosis not present

## 2016-05-16 ENCOUNTER — Ambulatory Visit (INDEPENDENT_AMBULATORY_CARE_PROVIDER_SITE_OTHER): Payer: BLUE CROSS/BLUE SHIELD | Admitting: Pediatrics

## 2016-05-16 VITALS — BP 154/92 | HR 79 | Temp 97.6°F | Ht 64.0 in | Wt 194.0 lb

## 2016-05-16 DIAGNOSIS — Z780 Asymptomatic menopausal state: Secondary | ICD-10-CM

## 2016-05-16 DIAGNOSIS — B029 Zoster without complications: Secondary | ICD-10-CM | POA: Diagnosis not present

## 2016-05-16 DIAGNOSIS — R030 Elevated blood-pressure reading, without diagnosis of hypertension: Secondary | ICD-10-CM | POA: Diagnosis not present

## 2016-05-16 MED ORDER — CITALOPRAM HYDROBROMIDE 10 MG PO TABS
10.0000 mg | ORAL_TABLET | Freq: Every day | ORAL | 3 refills | Status: DC
Start: 2016-05-16 — End: 2016-09-30

## 2016-05-16 NOTE — Progress Notes (Signed)
  Subjective:   Patient ID: Lorraine Mcdaniel, female    DOB: 03/06/1966, 51 y.o.   MRN: [id number] CC: Rash (face)  HPI: Lorraine Mcdaniel is a 51 y.o. female presenting for Rash (face)  Yesterday started having tingling, burning itching on L side of jaw Bothers her all the time Has had this before, turns into a rash, thought to be shingles Was treated with valacyclovir 01/2015 and elidil, rash improved Had second episode in 07/2015, treated with the same and had face and lip swelling, concern for allergic reaction so medication was stopped and added to allergy list Now back with similar symptoms to prior episodes This time says that the itching/burning radiates to the R side of her jaw No rash yet No ear tenderness or itching  no visible rash on her face now  Continues to have hot flash symptoms intermittently Current symptoms make it worse  Relevant past medical, surgical, family and social history reviewed. Allergies and medications reviewed and updated. History  Smoking Status  . Former Smoker  . Packs/day: 0.75  . Types: Cigarettes  . Start date: 11/19/1970  . Quit date: 12/29/2015  Smokeless Tobacco  . Never Used   ROS: Per HPI   Objective:    BP (!) 154/92   Pulse 79   Temp 97.6 F (36.4 C) (Oral)   Ht 5\' 4"  (1.626 m)   Wt 194 lb (88 kg)   BMI 33.30 kg/m   Wt Readings from Last 3 Encounters:  05/16/16 194 lb (88 kg)  02/16/16 189 lb 9.6 oz (86 kg)  08/19/15 175 lb 9.6 oz (79.7 kg)    Gen: NAD, alert, cooperative with exam, NCAT EYES: EOMI, no conjunctival injection, or no icterus ENT:  TMs pearly gray b/l, OP without erythema LYMPH: no cervical LAD CV: WWP Resp:normal WOB Abd: +BS, soft, NTND. no guarding or organomegaly Ext: No edema, warm Neuro: Alert and oriented Skin: slight ill-defined pinkness on anterior neck b/l, no vesicles or rash  Assessment & Plan:  Lorraine Mcdaniel was seen today for rash.  Diagnoses and all orders for this visit:  Herpes zoster without  complication Given symptoms, likely recurrent herpes zoster Possible allergy to valacyclovir causing face swelling Discussed options Offered allergy referral for to determine if truly allergic to valacyclovir, pt will think about it Discussed symptomatic care, lower L side of jaw involved, no ear, or eye involvement Discussed return precautions  Menopause Ongoing symptoms Wants to retry celexa, didn't take regularly before -     citalopram (CELEXA) 10 MG tablet; Take 1 tablet (10 mg total) by mouth daily.  Elevated blood pressure Recheck next visit, not usually elevated, not on meds now  Follow up plan: Return in about 2 weeks (around 05/30/2016) for with Dr. Oswaldo DoneVincent or [place] Hillhristy. Rex Krasarol Vincent, MD [facility]

## 2016-05-24 ENCOUNTER — Telehealth: Payer: Self-pay | Admitting: Family

## 2016-05-25 NOTE — Telephone Encounter (Signed)
Pt unable to keep appt. Works 9-5. She is not taking medications but is doing the other recommendations. If she has any issues she will call to make a follow up appt.

## 2016-05-26 ENCOUNTER — Ambulatory Visit: Payer: BLUE CROSS/BLUE SHIELD

## 2016-06-01 ENCOUNTER — Ambulatory Visit (INDEPENDENT_AMBULATORY_CARE_PROVIDER_SITE_OTHER): Payer: BLUE CROSS/BLUE SHIELD | Admitting: Family Medicine

## 2016-06-01 ENCOUNTER — Encounter: Payer: Self-pay | Admitting: Family Medicine

## 2016-06-01 VITALS — BP 141/83 | HR 85 | Temp 97.8°F | Ht 64.0 in | Wt 195.6 lb

## 2016-06-01 DIAGNOSIS — L209 Atopic dermatitis, unspecified: Secondary | ICD-10-CM | POA: Diagnosis not present

## 2016-06-01 MED ORDER — CETIRIZINE HCL 10 MG PO TABS: 10.0000 mg | ORAL_TABLET | Freq: Every day | ORAL | 11 refills | Status: DC

## 2016-06-01 MED ORDER — ALPRAZOLAM 0.25 MG PO TABS: 0.2500 mg | ORAL_TABLET | Freq: Two times a day (BID) | ORAL | 0 refills | Status: DC | PRN

## 2016-06-01 MED ORDER — DIPHENHYDRAMINE HCL 25 MG PO TABS: 25.0000 mg | ORAL_TABLET | Freq: Four times a day (QID) | ORAL | 0 refills | Status: DC | PRN

## 2016-06-01 NOTE — Progress Notes (Signed)
BP (!) 141/83 (BP Location: Left Arm, Patient Position: Sitting, Cuff Size: Large)   Pulse 85   Temp 97.8 F (36.6 C) (Oral)   Ht 5\' 4"  (1.626 m)   Wt 195 lb 9.6 oz (88.7 kg)   BMI 33.57 kg/m    Subjective:    Patient ID: Lorraine Mcdaniel, female    DOB: Jul 19, 1965, 51 y.o.   MRN: [id number]  HPI: Lorraine Mcdaniel is a 51 y.o. female presenting on 06/01/2016 for Rash (facial rash, itchy x 2 days)   HPI Facial rash  Patient has a facial rash that has reappeared this been itching going on for the past 2 days. He comes in waves and then regresses over the past couple days. She has been using topical Benadryl which does help the itch and has reduced some of the rash. She brings a picture with the rash looked like because it is mostly subsided on her face currently. It does look like on her camera could be raised welts which is what this she thinks it was. She initially was diagnosed with a possible shingles but now it is both sides and not as painful as it was. She denies any fevers or chills or redness or warmth. She cannot recall any specific allergens that is new. She has had a lot of stress in her life currently because of being sent for jury duty and her son having some drug abuse problems and coming home for the weekend.  Relevant past medical, surgical, family and social history reviewed and updated as indicated. Interim medical history since our last visit reviewed. Allergies and medications reviewed and updated.  Review of Systems  Constitutional: Negative for chills and fever.  Respiratory: Negative for chest tightness and shortness of breath.   Cardiovascular: Negative for chest pain and leg swelling.  Genitourinary: Negative for difficulty urinating and dysuria.  Musculoskeletal: Negative for back pain and gait problem.  Skin: Negative for rash.  Neurological: Negative for light-headedness and headaches.  Psychiatric/Behavioral: Negative for agitation and behavioral problems.  All  other systems reviewed and are negative.   Per HPI unless specifically indicated above     Objective:    BP (!) 141/83 (BP Location: Left Arm, Patient Position: Sitting, Cuff Size: Large)   Pulse 85   Temp 97.8 F (36.6 C) (Oral)   Ht 5\' 4"  (1.626 m)   Wt 195 lb 9.6 oz (88.7 kg)   BMI 33.57 kg/m   Wt Readings from Last 3 Encounters:  06/01/16 195 lb 9.6 oz (88.7 kg)  05/16/16 194 lb (88 kg)  02/16/16 189 lb 9.6 oz (86 kg)    Physical Exam  Constitutional: She is oriented to person, place, and time. She appears well-developed and well-nourished. No distress.  Eyes: Conjunctivae are normal.  Cardiovascular: Normal rate, regular rhythm, normal heart sounds and intact distal pulses.   No murmur heard. Pulmonary/Chest: Effort normal and breath sounds normal. No respiratory distress. She has no wheezes.  Musculoskeletal: Normal range of motion. She exhibits no edema or tenderness.  Neurological: She is alert and oriented to person, place, and time. Coordination normal.  Skin: Skin is warm and dry. Rash (Pruritic papular pink rash, mostly resolved today but she has a picture of what looked like this morning which was worse. It involves both cheeks and neck and upper chest.) noted. She is not diaphoretic.  Psychiatric: She has a normal mood and affect. Her behavior is normal.  Nursing note and vitals reviewed.  Assessment & Plan:   Problem List Items Addressed This Visit    None    Visit Diagnoses    Atopic dermatitis, unspecified type    -  Primary   Patient has a picture with her and it almost sounds like hives it comes and goes is bilateral on her face now not just one-sided. Refer to allergy   Relevant Medications   cetirizine (ZYRTEC) 10 MG tablet   diphenhydrAMINE (BENADRYL) 25 MG tablet   Other Relevant Orders   Ambulatory referral to Allergy       Follow up plan: Return if symptoms worsen or fail to improve.  Counseling provided for all of the vaccine  components Orders Placed This Encounter  Procedures  . Ambulatory referral to Allergy    Arville Care, MD [facility] 06/01/2016, 6:04 PM

## 2016-06-02 DIAGNOSIS — S29012A Strain of muscle and tendon of back wall of thorax, initial encounter: Secondary | ICD-10-CM | POA: Diagnosis not present

## 2016-06-22 ENCOUNTER — Encounter: Payer: BLUE CROSS/BLUE SHIELD | Admitting: *Deleted

## 2016-06-30 ENCOUNTER — Encounter: Payer: Self-pay | Admitting: Nurse Practitioner

## 2016-06-30 ENCOUNTER — Ambulatory Visit (INDEPENDENT_AMBULATORY_CARE_PROVIDER_SITE_OTHER): Payer: BLUE CROSS/BLUE SHIELD | Admitting: Nurse Practitioner

## 2016-06-30 VITALS — BP 159/81 | HR 69 | Temp 97.2°F | Ht 64.0 in | Wt 196.0 lb

## 2016-06-30 DIAGNOSIS — I10 Essential (primary) hypertension: Secondary | ICD-10-CM

## 2016-06-30 MED ORDER — LISINOPRIL 20 MG PO TABS: 20.0000 mg | ORAL_TABLET | Freq: Every day | ORAL | 3 refills | Status: DC

## 2016-06-30 NOTE — Patient Instructions (Signed)
DASH Eating Plan DASH stands for "Dietary Approaches to Stop Hypertension." The DASH eating plan is a healthy eating plan that has been shown to reduce high blood pressure (hypertension). It may also reduce your risk for type 2 diabetes, heart disease, and stroke. The DASH eating plan may also help with weight loss. What are tips for following this plan? General guidelines  Avoid eating more than 2,300 mg (milligrams) of salt (sodium) a day. If you have hypertension, you may need to reduce your sodium intake to 1,500 mg a day.  Limit alcohol intake to no more than 1 drink a day for nonpregnant women and 2 drinks a day for men. One drink equals 12 oz of beer, 5 oz of wine, or 1 oz of hard liquor.  Work with your health care provider to maintain a healthy body weight or to lose weight. Ask what an ideal weight is for you.  Get at least 30 minutes of exercise that causes your heart to beat faster (aerobic exercise) most days of the week. Activities may include walking, swimming, or biking.  Work with your health care provider or diet and nutrition specialist (dietitian) to adjust your eating plan to your individual calorie needs. Reading food labels  Check food labels for the amount of sodium per serving. Choose foods with less than 5 percent of the Daily Value of sodium. Generally, foods with less than 300 mg of sodium per serving fit into this eating plan.  To find whole grains, look for the word "whole" as the first word in the ingredient list. Shopping  Buy products labeled as "low-sodium" or "no salt added."  Buy fresh foods. Avoid canned foods and premade or frozen meals. Cooking  Avoid adding salt when cooking. Use salt-free seasonings or herbs instead of table salt or sea salt. Check with your health care provider or pharmacist before using salt substitutes.  Do not fry foods. Cook foods using healthy methods such as baking, boiling, grilling, and broiling instead.  Cook with  heart-healthy oils, such as olive, canola, soybean, or sunflower oil. Meal planning   Eat a balanced diet that includes: ? 5 or more servings of fruits and vegetables each day. At each meal, try to fill half of your plate with fruits and vegetables. ? Up to 6-8 servings of whole grains each day. ? Less than 6 oz of lean meat, poultry, or fish each day. A 3-oz serving of meat is about the same size as a deck of cards. One egg equals 1 oz. ? 2 servings of low-fat dairy each day. ? A serving of nuts, seeds, or beans 5 times each week. ? Heart-healthy fats. Healthy fats called Omega-3 fatty acids are found in foods such as flaxseeds and coldwater fish, like sardines, salmon, and mackerel.  Limit how much you eat of the following: ? Canned or prepackaged foods. ? Food that is high in trans fat, such as fried foods. ? Food that is high in saturated fat, such as fatty meat. ? Sweets, desserts, sugary drinks, and other foods with added sugar. ? Full-fat dairy products.  Do not salt foods before eating.  Try to eat at least 2 vegetarian meals each week.  Eat more home-cooked food and less restaurant, buffet, and fast food.  When eating at a restaurant, ask that your food be prepared with less salt or no salt, if possible. What foods are recommended? The items listed may not be a complete list. Talk with your dietitian about what   dietary choices are best for you. Grains Whole-grain or whole-wheat bread. Whole-grain or whole-wheat pasta. Brown rice. Oatmeal. Quinoa. Bulgur. Whole-grain and low-sodium cereals. Pita bread. Low-fat, low-sodium crackers. Whole-wheat flour tortillas. Vegetables Fresh or frozen vegetables (raw, steamed, roasted, or grilled). Low-sodium or reduced-sodium tomato and vegetable juice. Low-sodium or reduced-sodium tomato sauce and tomato paste. Low-sodium or reduced-sodium canned vegetables. Fruits All fresh, dried, or frozen fruit. Canned fruit in natural juice (without  added sugar). Meat and other protein foods Skinless chicken or turkey. Ground chicken or turkey. Pork with fat trimmed off. Fish and seafood. Egg whites. Dried beans, peas, or lentils. Unsalted nuts, nut butters, and seeds. Unsalted canned beans. Lean cuts of beef with fat trimmed off. Low-sodium, lean deli meat. Dairy Low-fat (1%) or fat-free (skim) milk. Fat-free, low-fat, or reduced-fat cheeses. Nonfat, low-sodium ricotta or cottage cheese. Low-fat or nonfat yogurt. Low-fat, low-sodium cheese. Fats and oils Soft margarine without trans fats. Vegetable oil. Low-fat, reduced-fat, or light mayonnaise and salad dressings (reduced-sodium). Canola, safflower, olive, soybean, and sunflower oils. Avocado. Seasoning and other foods Herbs. Spices. Seasoning mixes without salt. Unsalted popcorn and pretzels. Fat-free sweets. What foods are not recommended? The items listed may not be a complete list. Talk with your dietitian about what dietary choices are best for you. Grains Baked goods made with fat, such as croissants, muffins, or some breads. Dry pasta or rice meal packs. Vegetables Creamed or fried vegetables. Vegetables in a cheese sauce. Regular canned vegetables (not low-sodium or reduced-sodium). Regular canned tomato sauce and paste (not low-sodium or reduced-sodium). Regular tomato and vegetable juice (not low-sodium or reduced-sodium). Pickles. Olives. Fruits Canned fruit in a light or heavy syrup. Fried fruit. Fruit in cream or butter sauce. Meat and other protein foods Fatty cuts of meat. Ribs. Fried meat. Bacon. Sausage. Bologna and other processed lunch meats. Salami. Fatback. Hotdogs. Bratwurst. Salted nuts and seeds. Canned beans with added salt. Canned or smoked fish. Whole eggs or egg yolks. Chicken or turkey with skin. Dairy Whole or 2% milk, cream, and half-and-half. Whole or full-fat cream cheese. Whole-fat or sweetened yogurt. Full-fat cheese. Nondairy creamers. Whipped toppings.  Processed cheese and cheese spreads. Fats and oils Butter. Stick margarine. Lard. Shortening. Ghee. Bacon fat. Tropical oils, such as coconut, palm kernel, or palm oil. Seasoning and other foods Salted popcorn and pretzels. Onion salt, garlic salt, seasoned salt, table salt, and sea salt. Worcestershire sauce. Tartar sauce. Barbecue sauce. Teriyaki sauce. Soy sauce, including reduced-sodium. Steak sauce. Canned and packaged gravies. Fish sauce. Oyster sauce. Cocktail sauce. Horseradish that you find on the shelf. Ketchup. Mustard. Meat flavorings and tenderizers. Bouillon cubes. Hot sauce and Tabasco sauce. Premade or packaged marinades. Premade or packaged taco seasonings. Relishes. Regular salad dressings. Where to find more information:  National Heart, Lung, and Blood Institute: www.nhlbi.nih.gov  American Heart Association: www.heart.org Summary  The DASH eating plan is a healthy eating plan that has been shown to reduce high blood pressure (hypertension). It may also reduce your risk for type 2 diabetes, heart disease, and stroke.  With the DASH eating plan, you should limit salt (sodium) intake to 2,300 mg a day. If you have hypertension, you may need to reduce your sodium intake to 1,500 mg a day.  When on the DASH eating plan, aim to eat more fresh fruits and vegetables, whole grains, lean proteins, low-fat dairy, and heart-healthy fats.  Work with your health care provider or diet and nutrition specialist (dietitian) to adjust your eating plan to your individual   calorie needs. This information is not intended to replace advice given to you by your health care provider. Make sure you discuss any questions you have with your health care provider. Document Released: 03/31/2011 Document Revised: 04/04/2016 Document Reviewed: 04/04/2016 Elsevier Interactive Patient Education  2017 Elsevier Inc.  

## 2016-06-30 NOTE — Progress Notes (Signed)
   Subjective:    Patient ID: Lorraine Mcdaniel, female    DOB: 05/13/1965, 51 y.o.   MRN: [id number]  HPI Patient comes in today with c/o elevated blood pressure. She quit smoking about 1 year ago and has gained about 30-40bs. Says that her blood pressure has gradually been going up. Blood pressure at work today was 170/100.   Review of Systems  Constitutional: Negative.   HENT: Negative.   Eyes: Positive for visual disturbance (fuzzy).  Respiratory: Negative.   Cardiovascular: Negative.   Gastrointestinal: Negative.   Genitourinary: Negative.   Neurological: Headaches: very dull.  Hematological: Negative.   Psychiatric/Behavioral: Negative.   All other systems reviewed and are negative.      Objective:   Physical Exam  Constitutional: She is oriented to person, place, and time. She appears well-developed and well-nourished. No distress.  Cardiovascular: Normal rate and regular rhythm.   Pulmonary/Chest: Effort normal and breath sounds normal.  Abdominal: Soft. Bowel sounds are normal.  Musculoskeletal: She exhibits no edema.  Neurological: She is alert and oriented to person, place, and time.  Skin: Skin is warm.  Psychiatric: She has a normal mood and affect. Her behavior is normal. Judgment and thought content normal.   BP (!) 159/81   Pulse 69   Temp 97.2 F (36.2 C) (Oral)   Ht _0  (1.626 m)   Wt 196 lb (88.9 kg)   BMI 33.64 kg/m         Assessment & Plan:  1. Essential hypertension Dash diet Exercise encouraged - lisinopril (PRINIVIL,ZESTRIL) 20 MG tablet; Take 1 tablet (20 mg total) by mouth daily.  Dispense: 90 tablet; Refill: 3 - Post, FNP

## 2016-07-01 LAB — CMP14+EGFR
ALT: 18 IU/L (ref 0–32)
AST: 14 IU/L (ref 0–40)
Albumin/Globulin Ratio: 1.6 (ref 1.2–2.2)
Albumin: 4.5 g/dL (ref 3.5–5.5)
Alkaline Phosphatase: 73 IU/L (ref 39–117)
BUN/Creatinine Ratio: 10 (ref 9–23)
BUN: 8 mg/dL (ref 6–24)
Bilirubin Total: <0.2 mg/dL (ref 0.0–1.2)
CALCIUM: 9.5 mg/dL (ref 8.7–10.2)
CO2: 23 mmol/L (ref 18–29)
CREATININE: 0.77 mg/dL (ref 0.57–1.00)
Chloride: 99 mmol/L (ref 96–106)
GFR calc Af Amer: 103 mL/min/{1.73_m2} (ref >59)
GFR calc non Af Amer: 90 mL/min/{1.73_m2} (ref >59)
GLOBULIN, TOTAL: 2.8 g/dL (ref 1.5–4.5)
Glucose: 90 mg/dL (ref 65–99)
Potassium: 4.4 mmol/L (ref 3.5–5.2)
Sodium: 141 mmol/L (ref 134–144)
TOTAL PROTEIN: 7.3 g/dL (ref 6.0–8.5)

## 2016-07-04 ENCOUNTER — Encounter: Payer: BLUE CROSS/BLUE SHIELD | Admitting: *Deleted

## 2016-07-14 ENCOUNTER — Ambulatory Visit (INDEPENDENT_AMBULATORY_CARE_PROVIDER_SITE_OTHER): Payer: BLUE CROSS/BLUE SHIELD | Admitting: Pediatrics

## 2016-07-14 ENCOUNTER — Encounter: Payer: Self-pay | Admitting: Pediatrics

## 2016-07-14 VITALS — BP 136/83 | HR 69 | Temp 98.0°F | Ht 64.0 in | Wt 195.2 lb

## 2016-07-14 DIAGNOSIS — J029 Acute pharyngitis, unspecified: Secondary | ICD-10-CM

## 2016-07-14 DIAGNOSIS — I10 Essential (primary) hypertension: Secondary | ICD-10-CM | POA: Diagnosis not present

## 2016-07-14 DIAGNOSIS — J069 Acute upper respiratory infection, unspecified: Secondary | ICD-10-CM | POA: Diagnosis not present

## 2016-07-14 LAB — RAPID STREP SCREEN (MED CTR MEBANE ONLY): STREP GP A AG, IA W/REFLEX: NEGATIVE

## 2016-07-14 LAB — CULTURE, GROUP A STREP

## 2016-07-14 MED ORDER — LOSARTAN POTASSIUM 100 MG PO TABS: 100.0000 mg | ORAL_TABLET | Freq: Every day | ORAL | 0 refills | Status: DC

## 2016-07-14 NOTE — Progress Notes (Signed)
  Subjective:   Patient ID: Lorraine Mcdaniel, female    DOB: June 12, 1965, 51 y.o.   MRN: [id number] CC: Cough; Sore Throat; Sneezing; and Headache  HPI: Lorraine Mcdaniel is a 51 y.o. female presenting for Cough; Sore Throat; Sneezing; and Headache  Noticed white spots on the back of her throat Feeling hot and cold Decreased appetite Coughing some, slightly worse past two days Taking OTC cold meds Started two days ago No CP, no SOB  Has been coughing since she was started on lisinopril several weeks ago Has been a dry cough No fevers  Relevant past medical, surgical, family and social history reviewed. Allergies and medications reviewed and updated. History  Smoking Status  . Former Smoker  . Packs/day: 0.75  . Types: Cigarettes  . Start date: 11/19/1970  . Quit date: 12/29/2015  Smokeless Tobacco  . Never Used   ROS: Per HPI   Objective:    BP 136/83   Pulse 69   Temp 98 F (36.7 C) (Oral)   Ht '5\' 4"'$  (1.626 m)   Wt 195 lb 3.2 oz (88.5 kg)   BMI 33.51 kg/m   Wt Readings from Last 3 Encounters:  07/14/16 195 lb 3.2 oz (88.5 kg)  06/30/16 196 lb (88.9 kg)  06/01/16 195 lb 9.6 oz (88.7 kg)    Gen: NAD, alert, cooperative with exam, NCAT EYES: EOMI, no conjunctival injection, or no icterus ENT:  TMs pearly gray b/l, OP with mild erythema, exudate present L tonsil LYMPH: no cervical LAD CV: NRRR, normal S1/S2, no murmur, distal pulses 2+ b/l  Resp: CTABL, no wheezes, normal WOB Abd: +BS, soft, NTND. no guarding or organomegaly Ext: No edema, warm Neuro: Alert and oriented  Assessment & Plan:  Lorraine Mcdaniel was seen today for cough, sore throat, sneezing and headache.  Diagnoses and all orders for this visit:  Sore throat Acute URI Rapid neg, send for culture Discussed symptomatic care -     Rapid strep screen (not at [facility]) -     Culture, Group A Strep  Essential hypertension Stop lisinopril for cough, switch to losartan Check BPs at home, let us know if elevated -      losartan (COZAAR) 100 MG tablet; Take 1 tablet (100 mg total) by mouth daily. -     BMP8+EGFR; Future   Follow up plan: Return in about 3 months (around 10/14/2016). Assunta Found, MD [place]

## 2016-07-14 NOTE — Patient Instructions (Signed)
Netipot with distilled water 2-3 times a day to clear out sinuses Or Normal saline nasal spray Flonase steroid nasal spray Antihistamine daily such as cetirizine Lots of fluids  

## 2016-07-17 LAB — CULTURE, GROUP A STREP

## 2016-07-18 ENCOUNTER — Telehealth: Payer: Self-pay | Admitting: Pediatrics

## 2016-07-18 DIAGNOSIS — J40 Bronchitis, not specified as acute or chronic: Secondary | ICD-10-CM

## 2016-07-18 MED ORDER — AMOXICILLIN-POT CLAVULANATE 875-125 MG PO TABS: 1.0000 | ORAL_TABLET | Freq: Two times a day (BID) | ORAL | 0 refills | Status: DC

## 2016-07-18 MED ORDER — AZITHROMYCIN 250 MG PO TABS: ORAL_TABLET | ORAL | 0 refills | Status: DC

## 2016-07-18 NOTE — Telephone Encounter (Signed)
Patient aware and states that her body does not do good with azithromycin. Patient states she is not allergic but that it never works for her. Would like a different rx called in to the pharmacy and would also like to be put out of work until Wednesday. Please advise.

## 2016-07-18 NOTE — Telephone Encounter (Signed)
Sent in azithromycin, let me know if not improving

## 2016-07-28 ENCOUNTER — Telehealth: Payer: Self-pay | Admitting: Family

## 2016-07-28 MED ORDER — OSELTAMIVIR PHOSPHATE 75 MG PO CAPS: 75.0000 mg | ORAL_CAPSULE | Freq: Two times a day (BID) | ORAL | 0 refills | Status: DC

## 2016-07-28 NOTE — Telephone Encounter (Signed)
Pt aware.

## 2016-07-28 NOTE — Telephone Encounter (Signed)
Prescription sent to pharmacy.

## 2016-08-09 DIAGNOSIS — Z008 Encounter for other general examination: Secondary | ICD-10-CM | POA: Diagnosis not present

## 2016-08-09 DIAGNOSIS — E785 Hyperlipidemia, unspecified: Secondary | ICD-10-CM | POA: Diagnosis not present

## 2016-08-09 DIAGNOSIS — K219 Gastro-esophageal reflux disease without esophagitis: Secondary | ICD-10-CM | POA: Diagnosis not present

## 2016-08-09 DIAGNOSIS — Z719 Counseling, unspecified: Secondary | ICD-10-CM | POA: Diagnosis not present

## 2016-08-09 DIAGNOSIS — Z6833 Body mass index (BMI) 33.0-33.9, adult: Secondary | ICD-10-CM | POA: Diagnosis not present

## 2016-08-09 DIAGNOSIS — E559 Vitamin D deficiency, unspecified: Secondary | ICD-10-CM | POA: Diagnosis not present

## 2016-08-09 DIAGNOSIS — E669 Obesity, unspecified: Secondary | ICD-10-CM | POA: Diagnosis not present

## 2016-08-16 DIAGNOSIS — E785 Hyperlipidemia, unspecified: Secondary | ICD-10-CM | POA: Diagnosis not present

## 2016-08-16 DIAGNOSIS — E559 Vitamin D deficiency, unspecified: Secondary | ICD-10-CM | POA: Diagnosis not present

## 2016-08-16 DIAGNOSIS — I10 Essential (primary) hypertension: Secondary | ICD-10-CM | POA: Diagnosis not present

## 2016-09-15 DIAGNOSIS — H40023 Open angle with borderline findings, high risk, bilateral: Secondary | ICD-10-CM | POA: Diagnosis not present

## 2016-09-28 DIAGNOSIS — H1132 Conjunctival hemorrhage, left eye: Secondary | ICD-10-CM | POA: Diagnosis not present

## 2016-09-30 ENCOUNTER — Encounter: Payer: Self-pay | Admitting: Nurse Practitioner

## 2016-09-30 ENCOUNTER — Ambulatory Visit (INDEPENDENT_AMBULATORY_CARE_PROVIDER_SITE_OTHER): Payer: BLUE CROSS/BLUE SHIELD | Admitting: Nurse Practitioner

## 2016-09-30 VITALS — BP 99/63 | HR 91 | Temp 99.2°F | Ht 64.0 in | Wt 198.6 lb

## 2016-09-30 DIAGNOSIS — I10 Essential (primary) hypertension: Secondary | ICD-10-CM | POA: Diagnosis not present

## 2016-09-30 DIAGNOSIS — E559 Vitamin D deficiency, unspecified: Secondary | ICD-10-CM

## 2016-09-30 DIAGNOSIS — F411 Generalized anxiety disorder: Secondary | ICD-10-CM | POA: Diagnosis not present

## 2016-09-30 DIAGNOSIS — E785 Hyperlipidemia, unspecified: Secondary | ICD-10-CM | POA: Diagnosis not present

## 2016-09-30 DIAGNOSIS — Z1211 Encounter for screening for malignant neoplasm of colon: Secondary | ICD-10-CM

## 2016-09-30 DIAGNOSIS — K219 Gastro-esophageal reflux disease without esophagitis: Secondary | ICD-10-CM | POA: Diagnosis not present

## 2016-09-30 MED ORDER — VITAMIN D (ERGOCALCIFEROL) 1.25 MG (50000 UNIT) PO CAPS: 50000.0000 [IU] | ORAL_CAPSULE | ORAL | 1 refills | Status: DC

## 2016-09-30 MED ORDER — ALPRAZOLAM 0.25 MG PO TABS: 0.2500 mg | ORAL_TABLET | Freq: Two times a day (BID) | ORAL | 0 refills | Status: DC | PRN

## 2016-09-30 NOTE — Patient Instructions (Signed)
Vitamin D Deficiency Vitamin D deficiency is when your body does not have enough vitamin D. Vitamin D is important because:  It helps your body use other minerals that your body needs.  It helps keep your bones strong and healthy.  It may help to prevent some diseases.  It helps your heart and other muscles work well.  You can get vitamin D by:  Eating foods with vitamin D in them.  Drinking or eating milk or other foods that have had vitamin D added to them.  Taking a vitamin D supplement.  Being in the sun.  Not getting enough vitamin D can make your bones become soft. It can also cause other health problems. Follow these instructions at home:  Take medicines and supplements only as told by your doctor.  Eat foods that have vitamin D. These include: ? Dairy products, cereals, or juices with added vitamin D. Check the label for vitamin D. ? Fatty fish like salmon or trout. ? Eggs. ? Oysters.  Do not use tanning beds.  Stay at a healthy weight. Lose weight, if needed.  Keep all follow-up visits as told by your doctor. This is important. Contact a doctor if:  Your symptoms do not go away.  You feel sick to your stomach (nauseous).  Youthrow up (vomit).  You poop less often than usual or you have trouble pooping (constipation). This information is not intended to replace advice given to you by your health care provider. Make sure you discuss any questions you have with your health care provider. Document Released: 03/31/2011 Document Revised: 09/17/2015 Document Reviewed: 08/27/2014 Elsevier Interactive Patient Education  2018 Elsevier Inc.  

## 2016-09-30 NOTE — Addendum Note (Signed)
Addended by: Bennie PieriniMARTIN, MARY-MARGARET on: 09/30/2016 09:13 AM   Modules accepted: Orders

## 2016-09-30 NOTE — Progress Notes (Signed)
Subjective:    Patient ID: Lorraine Mcdaniel, female    DOB: Mar 24, 1966, 51 y.o.   MRN: [id number]  HPI  Lorraine Mcdaniel is here today for follow up of chronic medical problem.  Outpatient Encounter Prescriptions as of 09/30/2016  Medication Sig  . acetaminophen (TYLENOL) 500 MG tablet Take 1,000 mg by mouth every 6 (six) hours as needed for moderate pain.  Marland Kitchen ALPRAZolam (XANAX) 0.25 MG tablet Take 1 tablet (0.25 mg total) by mouth 2 (two) times daily as needed for anxiety.  Marland Kitchen atorvastatin (LIPITOR) 10 MG tablet Take 1 tablet (10 mg total) by mouth daily.  . cetirizine (ZYRTEC) 10 MG tablet Take 1 tablet (10 mg total) by mouth daily.  . diphenhydrAMINE (BENADRYL) 25 MG tablet Take 1 tablet (25 mg total) by mouth every 6 (six) hours as needed.  Marland Kitchen losartan (COZAAR) 100 MG tablet Take 1 tablet (100 mg total) by mouth daily.  Marland Kitchen LUMIGAN 0.01 % SOLN   . Vitamin D, Ergocalciferol, (DRISDOL) 50000 units CAPS capsule      1. Gastroesophageal reflux disease, esophagitis presence not specified  Is currently not on any meds. Symptoms occur very seldom  2. Vitamin D deficiency  On vitamin d 50000u weekly- no problems  3. Hyperlipidemia, unspecified hyperlipidemia type  Tries to watch diet.   4. GAD (generalized anxiety disorder)  On xanax- but very seldom takes  5.      hypertension          No c/o chest pain,SOB or HA- does not check blood pressure at home.  New complaints: None today     Review of Systems  Constitutional: Negative.  Negative for activity change and appetite change.  HENT: Negative.   Eyes: Negative for pain.  Respiratory: Negative for shortness of breath.   Cardiovascular: Negative for chest pain, palpitations and leg swelling.  Gastrointestinal: Negative for abdominal pain.  Endocrine: Negative for polydipsia.  Genitourinary: Negative.   Skin: Negative for rash.  Neurological: Negative for dizziness, weakness and headaches.  Hematological: Does not bruise/bleed easily.    Psychiatric/Behavioral: Negative.   All other systems reviewed and are negative.      Objective:   Physical Exam  Constitutional: She is oriented to person, place, and time. She appears well-developed and well-nourished.  HENT:  Nose: Nose normal.  Mouth/Throat: Oropharynx is clear and moist.  Eyes: EOM are normal.  Neck: Trachea normal, normal range of motion and full passive range of motion without pain. Neck supple. No JVD present. Carotid bruit is not present. No thyromegaly present.  Cardiovascular: Normal rate, regular rhythm, normal heart sounds and intact distal pulses.  Exam reveals no gallop and no friction rub.   No murmur heard. Pulmonary/Chest: Effort normal and breath sounds normal.  Abdominal: Soft. Bowel sounds are normal. She exhibits no distension and no mass. There is no tenderness.  Musculoskeletal: Normal range of motion. She exhibits no edema.  Lymphadenopathy:    She has no cervical adenopathy.  Neurological: She is alert and oriented to person, place, and time. She has normal reflexes.  Skin: Skin is warm and dry.  Psychiatric: She has a normal mood and affect. Her behavior is normal. Judgment and thought content normal.   BP 99/63   Pulse 91   Temp 99.2 F (37.3 C) (Oral)   Ht _0  (1.626 m)   Wt 198 lb 9.6 oz (90.1 kg)   BMI 34.09 kg/m       Assessment & Plan:  1. Gastroesophageal  reflux disease, esophagitis presence not specified Avoid spicy foods Do not eat 2 hours prior to bedtime  2. Vitamin D deficiency - Vitamin D, Ergocalciferol, (DRISDOL) 50000 units CAPS capsule; Take 1 capsule (50,000 Units total) by mouth every 7 (seven) days.  Dispense: 12 capsule; Refill: 1 - VITAMIN D 25 Hydroxy (Vit-D Deficiency, Fractures)  3. Hyperlipidemia, unspecified hyperlipidemia type Low fat diet - Lipid panel  4. GAD (generalized anxiety disorder) stress management  5. Essential hypertension Low sodium diet - CMP14+EGFR  6. Colon cancer  screening - Fecal occult blood, imunochemical; Future    Labs pending Health maintenance reviewed Diet and exercise encouraged Continue all meds Follow up  In 6 months   [place], FNP

## 2016-10-01 LAB — LIPID PANEL
CHOL/HDL RATIO: 3.5 ratio (ref 0.0–4.4)
Cholesterol, Total: 142 mg/dL (ref 100–199)
HDL: 41 mg/dL (ref >39)
LDL CALC: 64 mg/dL (ref 0–99)
Triglycerides: 187 mg/dL — ABNORMAL HIGH (ref 0–149)
VLDL CHOLESTEROL CAL: 37 mg/dL (ref 5–40)

## 2016-10-01 LAB — CMP14+EGFR
ALT: 24 IU/L (ref 0–32)
AST: 18 IU/L (ref 0–40)
Albumin/Globulin Ratio: 1.8 (ref 1.2–2.2)
Albumin: 4.4 g/dL (ref 3.5–5.5)
Alkaline Phosphatase: 76 IU/L (ref 39–117)
BUN/Creatinine Ratio: 11 (ref 9–23)
BUN: 10 mg/dL (ref 6–24)
Bilirubin Total: 0.2 mg/dL (ref 0.0–1.2)
CALCIUM: 9.8 mg/dL (ref 8.7–10.2)
CHLORIDE: 103 mmol/L (ref 96–106)
CO2: 21 mmol/L (ref 18–29)
CREATININE: 0.95 mg/dL (ref 0.57–1.00)
GFR, EST AFRICAN AMERICAN: 80 mL/min/{1.73_m2} (ref >59)
GFR, EST NON AFRICAN AMERICAN: 70 mL/min/{1.73_m2} (ref >59)
GLUCOSE: 93 mg/dL (ref 65–99)
Globulin, Total: 2.4 g/dL (ref 1.5–4.5)
Potassium: 4.6 mmol/L (ref 3.5–5.2)
Sodium: 141 mmol/L (ref 134–144)
TOTAL PROTEIN: 6.8 g/dL (ref 6.0–8.5)

## 2016-10-01 LAB — VITAMIN D 25 HYDROXY (VIT D DEFICIENCY, FRACTURES): VIT D 25 HYDROXY: 32.6 ng/mL (ref 30.0–100.0)

## 2016-10-05 DIAGNOSIS — Z1231 Encounter for screening mammogram for malignant neoplasm of breast: Secondary | ICD-10-CM | POA: Diagnosis not present

## 2016-10-11 ENCOUNTER — Other Ambulatory Visit: Payer: Self-pay | Admitting: Family

## 2016-10-11 DIAGNOSIS — N63 Unspecified lump in unspecified breast: Secondary | ICD-10-CM

## 2016-10-17 IMAGING — DX DG CHEST 2V
2 series · 2 of 2 positions shown · non-contrast
Comparison: None.

CLINICAL DATA: Left arm and chest pain.  Tachycardia.

EXAM:
CHEST  2 VIEW

[chest pa]
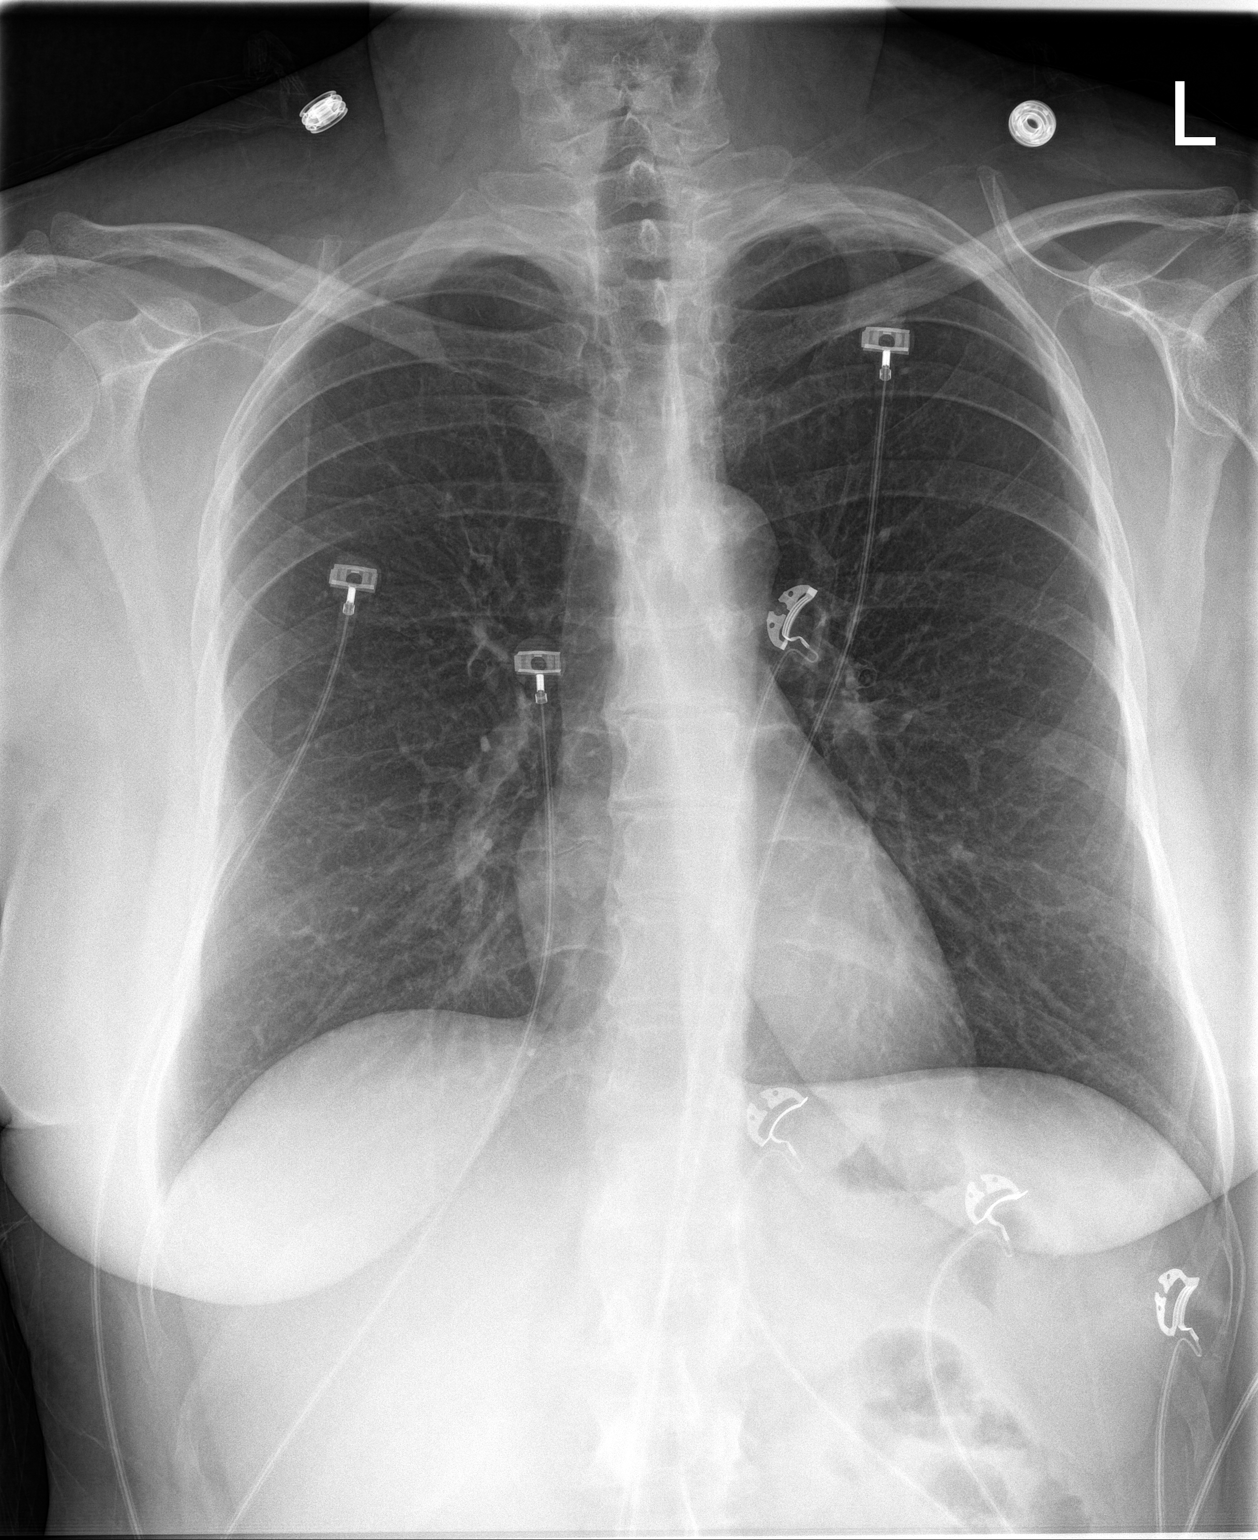

[chest lat]
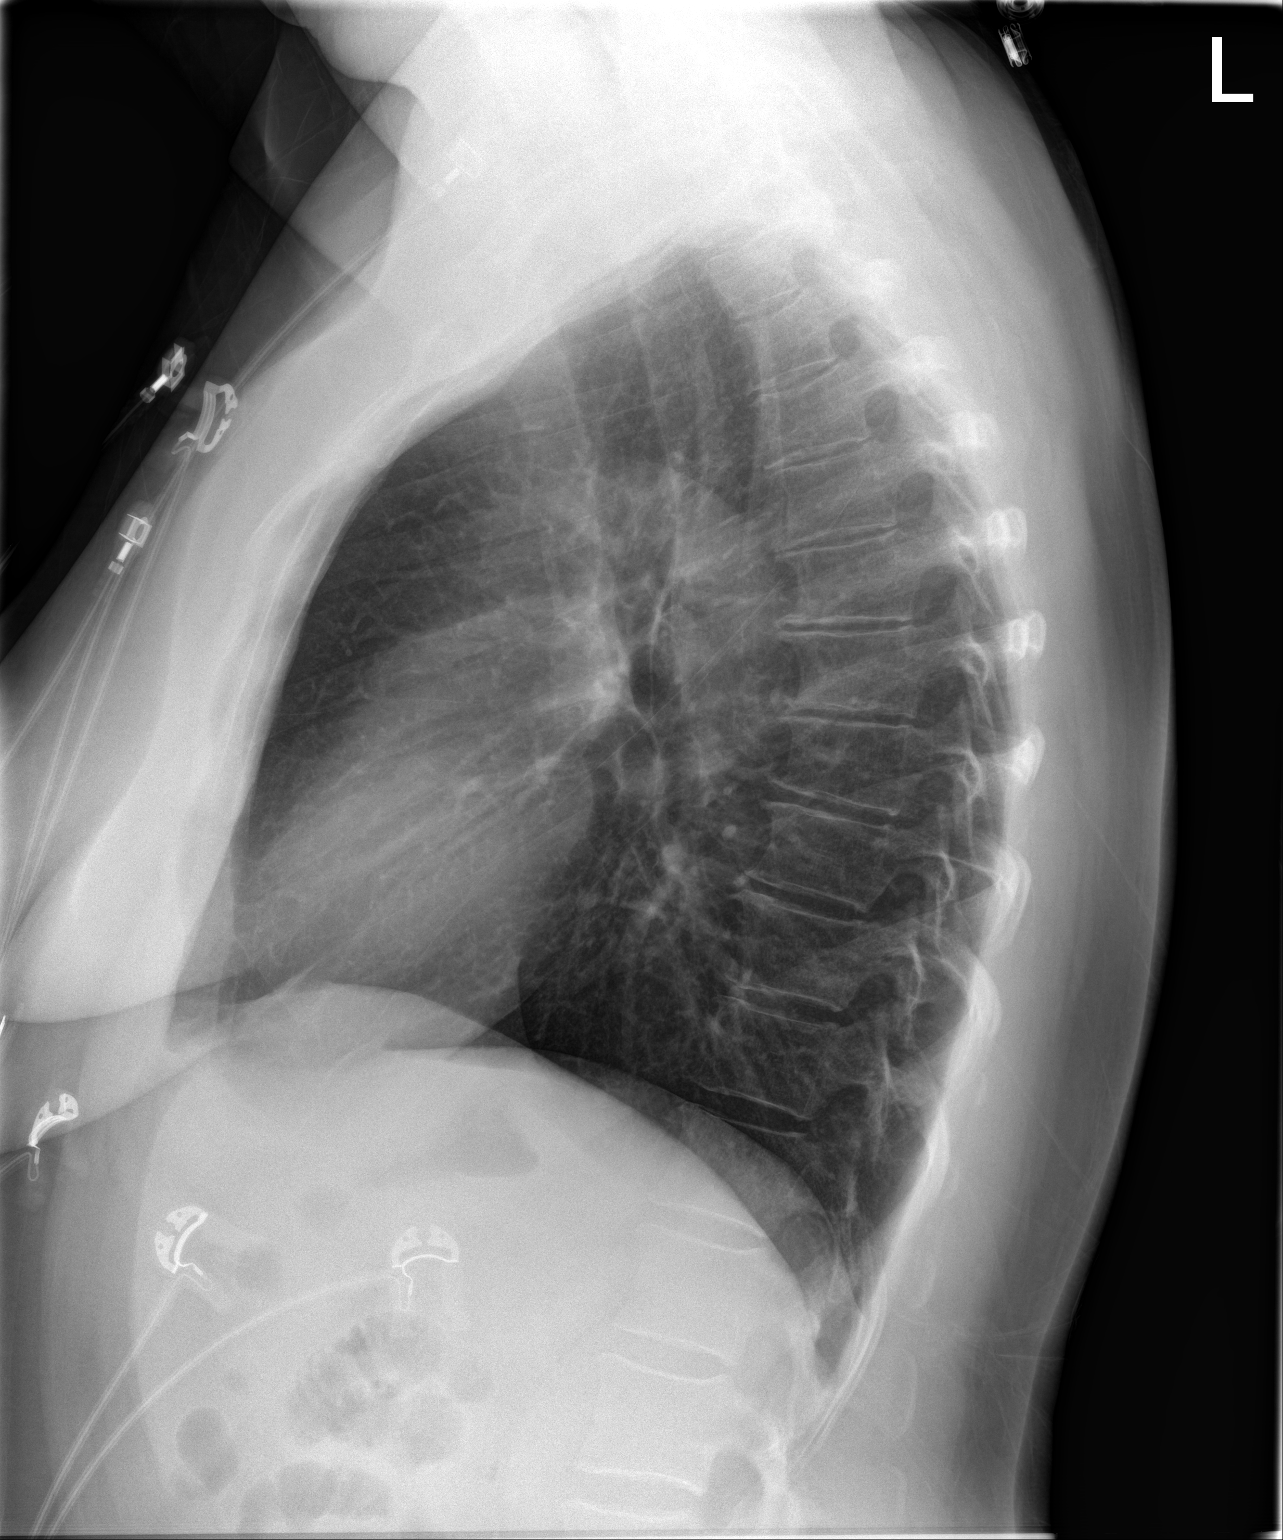

[2 of 2 positions shown; findings below may reference images not displayed]

FINDINGS: Mild hyperinflation. Numerous leads and wires project over the
chest. Midline trachea. Normal heart size and mediastinal contours.
No pleural effusion or pneumothorax. mild biapical pleural
thickening. Clear lungs.
IMPRESSION: Hyperinflation, without acute disease.

## 2016-10-18 DIAGNOSIS — E559 Vitamin D deficiency, unspecified: Secondary | ICD-10-CM | POA: Diagnosis not present

## 2016-10-18 DIAGNOSIS — Z79899 Other long term (current) drug therapy: Secondary | ICD-10-CM | POA: Diagnosis not present

## 2016-10-18 DIAGNOSIS — I10 Essential (primary) hypertension: Secondary | ICD-10-CM | POA: Diagnosis not present

## 2016-10-18 DIAGNOSIS — E785 Hyperlipidemia, unspecified: Secondary | ICD-10-CM | POA: Diagnosis not present

## 2016-10-25 ENCOUNTER — Encounter (HOSPITAL_COMMUNITY): Payer: Self-pay

## 2016-10-25 ENCOUNTER — Ambulatory Visit (HOSPITAL_COMMUNITY)
Admission: RE | Admit: 2016-10-25 | Discharge: 2016-10-25 | Disposition: A | Discharge status: Home / Self Care | Payer: BLUE CROSS/BLUE SHIELD | Source: Ambulatory Visit | Attending: Family | Admitting: Family

## 2016-10-25 DIAGNOSIS — R922 Inconclusive mammogram: Secondary | ICD-10-CM | POA: Diagnosis not present

## 2016-10-25 DIAGNOSIS — N63 Unspecified lump in unspecified breast: Secondary | ICD-10-CM

## 2016-11-01 DIAGNOSIS — E785 Hyperlipidemia, unspecified: Secondary | ICD-10-CM | POA: Diagnosis not present

## 2016-11-01 DIAGNOSIS — Z719 Counseling, unspecified: Secondary | ICD-10-CM | POA: Diagnosis not present

## 2016-11-01 DIAGNOSIS — Z6833 Body mass index (BMI) 33.0-33.9, adult: Secondary | ICD-10-CM | POA: Diagnosis not present

## 2016-11-01 DIAGNOSIS — K219 Gastro-esophageal reflux disease without esophagitis: Secondary | ICD-10-CM | POA: Diagnosis not present

## 2016-11-01 DIAGNOSIS — E559 Vitamin D deficiency, unspecified: Secondary | ICD-10-CM | POA: Diagnosis not present

## 2016-11-01 DIAGNOSIS — E669 Obesity, unspecified: Secondary | ICD-10-CM | POA: Diagnosis not present

## 2016-11-01 DIAGNOSIS — Z008 Encounter for other general examination: Secondary | ICD-10-CM | POA: Diagnosis not present

## 2016-11-07 ENCOUNTER — Ambulatory Visit (INDEPENDENT_AMBULATORY_CARE_PROVIDER_SITE_OTHER): Payer: BLUE CROSS/BLUE SHIELD | Admitting: Family

## 2016-11-07 ENCOUNTER — Encounter: Payer: Self-pay | Admitting: Family

## 2016-11-07 VITALS — BP 128/69 | HR 69 | Temp 97.3°F | Ht 64.0 in | Wt 200.2 lb

## 2016-11-07 DIAGNOSIS — R21 Rash and other nonspecific skin eruption: Secondary | ICD-10-CM | POA: Diagnosis not present

## 2016-11-07 DIAGNOSIS — L239 Allergic contact dermatitis, unspecified cause: Secondary | ICD-10-CM

## 2016-11-07 NOTE — Patient Instructions (Signed)

## 2016-11-07 NOTE — Progress Notes (Signed)
   Subjective:    Patient ID: Lorraine Mcdaniel, female    DOB: Sep 15, 1965, 51 y.o.   MRN: [id number]  HPI Pt presents to the office today with recurrent left sided swelling of her face. Pt states this occurs 2-3 times year. Pt is unsure of the cause. Denies any new medications, exposure plants, any new chemicals, or animal bites. She states she uses dye free laundry detergent and fragrance free dove soap and these have not changes in years.    PT states she takes a daily zyrtec.    Review of Systems  Skin: Positive for color change.  All other systems reviewed and are negative.      Objective:   Physical Exam  Constitutional: She is oriented to person, place, and time. She appears well-developed and well-nourished. No distress.  HENT:  Head: Normocephalic and atraumatic.  Right Ear: External ear normal.  Left Ear: External ear normal.  Nose: Nose normal.  Mouth/Throat: Oropharynx is clear and moist.  Eyes: Pupils are equal, round, and reactive to light.  Neck: Normal range of motion. Neck supple. No thyromegaly present.  Cardiovascular: Normal rate, regular rhythm, normal heart sounds and intact distal pulses.   No murmur heard. Pulmonary/Chest: Effort normal and breath sounds normal. No respiratory distress. She has no wheezes.  Abdominal: Soft. Bowel sounds are normal. She exhibits no distension. There is no tenderness.  Musculoskeletal: Normal range of motion. She exhibits no edema or tenderness.  Neurological: She is alert and oriented to person, place, and time.  Skin: Skin is warm and dry. Rash noted. There is erythema.  Erythemas rash on left cheek extending onto neck, approx 9.5cmX11cm  Psychiatric: She has a normal mood and affect. Her behavior is normal. Judgment and thought content normal.  Vitals reviewed.         BP 128/69   Pulse 69   Temp (!) 97.3 F (36.3 C) (Oral)   Ht 5\' 4"  (1.626 m)   Wt 200 lb 3.2 oz (90.8 kg)   BMI 34.36 kg/m      Assessment &  Plan:  1. Allergic contact dermatitis, unspecified trigger - Ambulatory referral to Allergy - Alpha-Gal Panel  2. Rash of face - Alpha-Gal Panel  Pt states she can not tolerate steroids  Start Benadryl every 6 hours Do not scratch Cool compresses  Follow up with allergen specialists  Discussed starting a journal of what she ate, drink, and was exposed to, to see if there are any common factors RTO prn   Jannifer Rodneyhristy Jazzlyn Huizenga, FNP

## 2016-11-08 ENCOUNTER — Telehealth: Payer: Self-pay | Admitting: Family

## 2016-11-08 NOTE — Telephone Encounter (Signed)
Per Jannifer Rodneyhristy Hawks, pt can come in for DepoMedrol 80mg  inj. Pt may also take Benadryl 50mg  q 4-6 hours prn Work note given  Pt notified Wants to try Benadryl today Will come into clinic for inj if no better tomorrow

## 2016-11-09 ENCOUNTER — Ambulatory Visit (INDEPENDENT_AMBULATORY_CARE_PROVIDER_SITE_OTHER): Payer: BLUE CROSS/BLUE SHIELD | Admitting: Family Medicine

## 2016-11-09 ENCOUNTER — Encounter: Payer: Self-pay | Admitting: Family Medicine

## 2016-11-09 VITALS — BP 134/87 | HR 82 | Temp 99.4°F | Ht 64.0 in | Wt 196.5 lb

## 2016-11-09 DIAGNOSIS — J302 Other seasonal allergic rhinitis: Secondary | ICD-10-CM | POA: Diagnosis not present

## 2016-11-09 DIAGNOSIS — Z0289 Encounter for other administrative examinations: Secondary | ICD-10-CM

## 2016-11-09 DIAGNOSIS — R220 Localized swelling, mass and lump, head: Secondary | ICD-10-CM | POA: Diagnosis not present

## 2016-11-09 MED ORDER — EPINEPHRINE 0.3 MG/0.3ML IJ SOAJ
0.3000 mg | Freq: Once | INTRAMUSCULAR | 1 refills | Status: AC
Start: 2016-11-09 — End: 2016-11-09

## 2016-11-09 MED ORDER — METHYLPREDNISOLONE ACETATE 80 MG/ML IJ SUSP
80.0000 mg | Freq: Once | INTRAMUSCULAR | Status: AC
  Administered 2016-11-09: 80 mg via INTRAMUSCULAR

## 2016-11-09 MED ORDER — CEPHALEXIN 500 MG PO CAPS: 500.0000 mg | ORAL_CAPSULE | Freq: Four times a day (QID) | ORAL | 0 refills | Status: DC

## 2016-11-09 NOTE — Progress Notes (Signed)
BP 134/87   Pulse 82   Temp 99.4 F (37.4 C) (Oral)   Ht 5\' 4"  (1.626 m)   Wt 196 lb 8 oz (89.1 kg)   BMI 33.73 kg/m    Subjective:    Patient ID: Lorraine Mcdaniel, female    DOB: 1966-04-25, 51 y.o.   MRN: [id number]  HPI: Lorraine Mcdaniel is a 51 y.o. female presenting on 11/09/2016 for Swelling in face (has worsened since seeing Christy on 11/07/16)   HPI Swelling of face Patient has been having swelling of her left face. She has been having this on for the past 4 days. She was seen 3 days ago and told to increase Benadryl and use topical Benadryl and moisturizing creams and continue her Zyrtec and see if it would improve. She says she has an allergy to oral prednisone because her reaction she had previously so she was not given that that visit. She has been having swelling especially on the left side of her face but now is starting to creep over the right side. She says she gets like this around this time of year every year for the past 4 years. Swelling is worse than what she typically gets though because now her eye on the left side is almost swollen shut. She does have a little bit of drainage when she compresses to the site that is mostly clear and yellow in nature. She denies any fevers or chills but does say that the site feels very warm to touch. She has also been using cool compresses and despite all of this she feels like it is worsening.  Relevant past medical, surgical, family and social history reviewed and updated as indicated. Interim medical history since our last visit reviewed. Allergies and medications reviewed and updated.  Review of Systems  Constitutional: Negative for chills and fever.  HENT: Positive for facial swelling and sneezing. Negative for congestion, ear discharge, ear pain, rhinorrhea, sinus pain, sinus pressure and sore throat.   Eyes: Negative for pain, discharge, redness and visual disturbance.  Respiratory: Negative for cough, chest tightness and shortness  of breath.   Cardiovascular: Negative for chest pain and leg swelling.  Musculoskeletal: Negative for back pain and gait problem.  Skin: Negative for rash.  Neurological: Negative for light-headedness and headaches.  Psychiatric/Behavioral: Negative for agitation and behavioral problems.  All other systems reviewed and are negative.   Per HPI unless specifically indicated above        Objective:    BP 134/87   Pulse 82   Temp 99.4 F (37.4 C) (Oral)   Ht 5\' 4"  (1.626 m)   Wt 196 lb 8 oz (89.1 kg)   BMI 33.73 kg/m   Wt Readings from Last 3 Encounters:  11/09/16 196 lb 8 oz (89.1 kg)  11/07/16 200 lb 3.2 oz (90.8 kg)  09/30/16 198 lb 9.6 oz (90.1 kg)    Physical Exam  Constitutional: She is oriented to person, place, and time. She appears well-developed and well-nourished. No distress.  HENT:  Significant swelling to the left side of her face around the eye and along her cheek always down to the bottom of her face. Pink discoloration to the skin on both sides and warm to palpation. No purulence or drainage or induration noted on exam. She does have some pink discoloration on the right side of her face as well along the cheek but no swelling. No papules are noted.  Eyes: Conjunctivae are normal.  Cardiovascular: Normal rate,  regular rhythm, normal heart sounds and intact distal pulses.   No murmur heard. Pulmonary/Chest: Effort normal and breath sounds normal. No respiratory distress. She has no wheezes.  Musculoskeletal: Normal range of motion. She exhibits no edema or tenderness.  Neurological: She is alert and oriented to person, place, and time. Coordination normal.  Skin: Skin is warm and dry. No rash noted. She is not diaphoretic.  Psychiatric: She has a normal mood and affect. Her behavior is normal.  Nursing note and vitals reviewed.       Assessment & Plan:   Problem List Items Addressed This Visit    None    Visit Diagnoses    Swelling of left side of face     -  Primary   Relevant Medications   cephALEXin (KEFLEX) 500 MG capsule   methylPREDNISolone acetate (DEPO-MEDROL) injection 80 mg (Completed)   EPINEPHrine (EPIPEN 2-PAK) 0.3 mg/0.3 mL IJ SOAJ injection   Seasonal allergic rhinitis, unspecified trigger       We got an allergy appointment for the patient's with Dr. Dellis AnesGallagher on 11/10/2016 at 1:30   Relevant Medications   methylPREDNISolone acetate (DEPO-MEDROL) injection 80 mg (Completed)   EPINEPHrine (EPIPEN 2-PAK) 0.3 mg/0.3 mL IJ SOAJ injection       Follow up plan: Return if symptoms worsen or fail to improve.  Counseling provided for all of the vaccine components No orders of the defined types were placed in this encounter.   Arville CareJoshua Quantina Dershem, MD [facility] 11/09/2016, 10:25 AM

## 2016-11-10 ENCOUNTER — Encounter: Payer: Self-pay | Admitting: Allergy & Immunology

## 2016-11-10 ENCOUNTER — Ambulatory Visit (INDEPENDENT_AMBULATORY_CARE_PROVIDER_SITE_OTHER): Payer: BLUE CROSS/BLUE SHIELD | Admitting: Allergy & Immunology

## 2016-11-10 ENCOUNTER — Other Ambulatory Visit: Payer: Self-pay | Admitting: Allergy & Immunology

## 2016-11-10 VITALS — BP 118/70 | HR 91 | Temp 98.0°F | Resp 18 | Ht 64.0 in | Wt 197.2 lb

## 2016-11-10 DIAGNOSIS — R21 Rash and other nonspecific skin eruption: Secondary | ICD-10-CM

## 2016-11-10 DIAGNOSIS — J309 Allergic rhinitis, unspecified: Secondary | ICD-10-CM | POA: Diagnosis not present

## 2016-11-10 DIAGNOSIS — L209 Atopic dermatitis, unspecified: Secondary | ICD-10-CM | POA: Diagnosis not present

## 2016-11-10 NOTE — Progress Notes (Signed)
NEW PATIENT  Date of Service/Encounter:  11/11/16  Referring provider: Sharion Balloon, FNP   Assessment:   Rash  Atopic dermatitis   Plan/Recommendations:   1. Rash - I anticipate that this is related to an environmental trigger given the seasonality of it, so we will check an environmental allergen panel. - We will also look for evidence of autoimmune disease with a CRP, ESR, or ANA. - If the environmental allergen panel is negative, we will look into patch testing to look for contact dermatitis from chemicals.  - In the meantime, double the dose of Zyrtec (cetirizine) to one tablet twice daily. - We will add an eye drop to help with the itchy eyes (Pazeo one drop per eye twice daily). - We will add Eucrisa one application twice daily (non-steroidal ointment) that can help with the rash.  2. Return in about 3 months (around 02/10/2017).   Subjective:   Lorraine Mcdaniel is a 51 y.o. female presenting today for evaluation of  Chief Complaint  Patient presents with  . Rash    face and neck    Lorraine Mcdaniel has a history of the following: Patient Active Problem List   Diagnosis Date Noted  . Essential hypertension 09/30/2016  . Gastroesophageal reflux disease 02/16/2016  . Menopause 02/16/2016  . Hyperlipidemia 10/06/2014  . Vitamin D deficiency 08/20/2014  . GAD (generalized anxiety disorder) 07/10/2014    History obtained from: chart review and patient.  Lorraine Mcdaniel was referred by Sharion Balloon, FNP.     Lorraine Mcdaniel is a 51 y.o. female presenting for evaluation of a rash. She has had a rash for 3-4 years, which only occurs in the summer. It is only on her face and involves her eyes sometimes. It is both burning and itching. She has tried treating it with benadryl ointment as well as pills. She on Zyrtec every night. It flared this week and she has seen her PCP on two occasions this week alone. She does have swelling with this that have led to closure of her eyes. She  is allergic to prednisone, but she was given a dose of IM prednisone yesterday. She was put on an antibiotic because there was a concern for a skin infection (Keflex). She is being tested for alpha gal sensitivity ([place]). This only occurs in the summer time for the most part. She does a "white spot" in the back of her throat, otherwise she denies systemic symptoms. She does endorse some pain in her upper shoulder and upper arm for the last month, otherwise no pain.   She did have some weight gain since stopping cigarette smoking around one year ago, so she is trying to do some more exercising. The exercising seems to make it worsen with the increased sweating. She does have itchy watery eyes. She does endorse some nasal congestion. There are no animals at home at all. She does eat all of the major food allergens, although she does not eat much soy. She has been treated for poison oak at one point without improvement.   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  She does have ear fullness on the left with epistaxis on the left as well. This has improved. CBC at work has been normal. She does have a history of low vitamin D and is on a supplement for this. Otherwise,  there is no history of other atopic diseases, including asthma, drug allergies, food allergies, stinging insect allergies, or urticaria. There is no significant infectious history. Vaccinations are up to date.    Past Medical History: Patient Active Problem List   Diagnosis Date Noted  . Essential hypertension 09/30/2016  . Gastroesophageal reflux disease 02/16/2016  . Menopause 02/16/2016  . Hyperlipidemia 10/06/2014  . Vitamin D deficiency 08/20/2014  . GAD (generalized anxiety disorder) 07/10/2014    Medication List:  Allergies as of 11/10/2016      Reactions   Prednisone Rash   Elidel  [pimecrolimus]    Cream may cause lip and facial swelling.   Lisinopril    cough   Valtrex [valacyclovir Hcl] Other (See Comments)   Lips and facial swelling   Percocet [oxycodone-acetaminophen] Other (See Comments)   GI upset   Sulfa Antibiotics Palpitations      Medication List       Accurate as of 11/10/16 11:59 PM. Always use your most recent med list.          acetaminophen 500 MG tablet Commonly known as:  TYLENOL Take 1,000 mg by mouth every 6 (six) hours as needed for moderate pain.   ALPRAZolam 0.25 MG tablet Commonly known as:  XANAX Take 1 tablet (0.25 mg total) by mouth 2 (two) times daily as needed for anxiety.   atorvastatin 10 MG tablet Commonly known as:  LIPITOR Take 1 tablet (10 mg total) by mouth daily.   cephALEXin 500 MG capsule Commonly known as:  KEFLEX Take 1 capsule (500 mg total) by mouth 4 (four) times daily.   cetirizine 10 MG tablet Commonly known as:  ZYRTEC Take 1 tablet (10 mg total) by mouth daily.   diphenhydrAMINE 25 MG tablet Commonly known as:  BENADRYL Take 1 tablet (25 mg total) by mouth every 6 (six) hours as needed.   EPINEPHrine 0.3 mg/0.3 mL Soaj injection Commonly known as:  EPI-PEN Inject 0.3 mg into the muscle once.   losartan 100 MG tablet Commonly known as:  COZAAR Take 1 tablet (100 mg total) by mouth daily.   LUMIGAN 0.01 % Soln Generic drug:  bimatoprost   Vitamin D (Ergocalciferol) 50000 units Caps capsule Commonly known as:  DRISDOL Take 1 capsule (50,000 Units total) by mouth every 7 (seven) days.       Birth History: non-contributory.   Developmental History: non-contributory.   Past Surgical History: Past Surgical History:  Procedure Laterality Date  . ABDOMINAL HYSTERECTOMY  1991     Family History: Family History  Problem Relation Age of Onset  . Heart disease Mother   . Arthritis Mother   . Stroke Father   . Hypertension Father   . Heart disease Father   . Cancer Sister         breast cancer in 66's     Social History: Lorraine Mcdaniel lives at home with her family. They live in a 52yo home. There is wood and carpeting throughout the home. They have gas heating and window units for cooling. There are no animals inside or outside of the home. There are no dust mite coverings on the bedding. She quit smoking ten months ago. 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 She has worked there for ten years.     Review of Systems: a 14-point review of systems is pertinent for what is mentioned in HPI.  Otherwise, all other systems were  negative. Constitutional: negative other than that listed in the HPI Eyes: negative other than that listed in the HPI Ears, nose, mouth, throat, and face: negative other than that listed in the HPI Respiratory: negative other than that listed in the HPI Cardiovascular: negative other than that listed in the HPI Gastrointestinal: negative other than that listed in the HPI Genitourinary: negative other than that listed in the HPI Integument: negative other than that listed in the HPI Hematologic: negative other than that listed in the HPI Musculoskeletal: negative other than that listed in the HPI Neurological: negative other than that listed in the HPI Allergy/Immunologic: negative other than that listed in the HPI    Objective:   Blood pressure 118/70, pulse 91, temperature 98 F (36.7 C), resp. rate 18, height _0  (1.626 m), weight 197 lb 3.2 oz (89.4 kg), SpO2 97 %. Body mass index is 33.85 kg/m.   Physical Exam:  General: Alert, interactive, in no acute distress. Very pleasant, although understandably at wit's end with this rash Eyes: Clear discharge bilaterally, Periorbital edema with erythema bilaterally, No conjunctival injection present on the right, No conjunctival injection present on the left, PERRL bilaterally and No Horner-Trantas dots present Ears: Right TM pearly gray with  normal light reflex, Left TM pearly gray with normal light reflex, Right TM intact without perforation and Left TM intact without perforation.  Nose/Throat: External nose within normal limits and septum midline, turbinates edematous and pale with clear discharge, post-pharynx erythematous with cobblestoning in the posterior oropharynx. Tonsils 2+ without exudates Neck: Supple without thyromegaly.  Adenopathy: Shoddy bilateral anterior cervical lymphadenopathy. and No enlarged lymph nodes appreciated in the occipital, axillary, epitrochlear, inguinal, or popliteal regions. Lungs: Clear to auscultation without wheezing, rhonchi or rales. No increased work of breathing. CV: Normal S1/S2, no murmurs. Capillary refill <2 seconds.  Abdomen: Nondistended, nontender. No guarding or rebound tenderness. Bowel sounds present in all fields and hypoactive  Skin: Dry, erythematous, excoriated patches on the bilateral cheeks extendings upwards around the eyes and over the nose. Extremities:  No clubbing, cyanosis or edema. Neuro:   Grossly intact. No focal deficits appreciated. Responsive to questions.    Diagnostic studies: deferred due to recent antihistamine use    Salvatore Marvel, MD [facility]

## 2016-11-10 NOTE — Patient Instructions (Addendum)
1. Rash - I anticipate that this is related to an environmental trigger given the seasonality of it, so we will check an environmental allergen panel. - We will also look for evidence of autoimmune disease with a CRP, ESR, or ANA. - If the environmental allergen panel is negative, we will look into patch testing to look for contact dermatitis from chemicals.  - In the meantime, double the dose of Zyrtec (cetirizine) to one tablet twice daily. - We will add an eye drop to help with the itchy eyes (Pazeo one drop per eye twice daily). - We will add Eucrisa one application twice daily (non-steroidal ointment) that can help with the rash.  2. Return in about 3 months (around 02/10/2017).  Please inform us of any Emergency Department visits, hospitalizations, or changes in symptoms. Call us before going to the ED for breathing or allergy symptoms since we might be able to fit you in for a sick visit. Feel free to contact us anytime with any questions, problems, or concerns.  It was a pleasure to meet you today! Happy summer!   Websites that have reliable patient information: 1. American Academy of Asthma, Allergy, and Immunology: www.aaaai.org 2. Food Allergy Research and Education (FARE): foodallergy.org 3. Mothers of Asthmatics: http://www.asthmacommunitynetwork.org 4. American College of Allergy, Asthma, and Immunology: www.acaai.org

## 2016-11-11 LAB — ALPHA-GAL PANEL
Beef (Bos spp) IgE: <0.1 kU/L (ref <0.35)
Class Interpretation: 0
Class Interpretation: 0
Lamb/Mutton (Ovis spp) IgE: <0.1 kU/L (ref <0.35)
PORK CLASS INTERPRETATION: 0

## 2016-11-11 LAB — TRYPTASE: Tryptase: 3.5 ug/L (ref <11)

## 2016-11-11 LAB — ANA: Anti Nuclear Antibody(ANA): NEGATIVE

## 2016-11-11 LAB — SEDIMENTATION RATE: Sed Rate: 8 mm/hr (ref 0–30)

## 2016-11-11 LAB — C-REACTIVE PROTEIN: CRP: 7.3 mg/L (ref <8.0)

## 2016-11-11 MED ORDER — CRISABOROLE 2 % EX OINT: 1.0000 "application " | TOPICAL_OINTMENT | Freq: Two times a day (BID) | CUTANEOUS | 5 refills | Status: DC | PRN

## 2016-11-11 MED ORDER — CETIRIZINE HCL 10 MG PO TABS: 10.0000 mg | ORAL_TABLET | Freq: Two times a day (BID) | ORAL | 5 refills | Status: DC

## 2016-11-11 MED ORDER — OLOPATADINE HCL 0.7 % OP SOLN: 1.0000 [drp] | Freq: Two times a day (BID) | OPHTHALMIC | 5 refills | Status: DC | PRN

## 2016-11-17 LAB — CP584 ZONE 3
Allergen, A. alternata, m6: <0.1 kU/L
Allergen, Black Locust, Acacia9: <0.1 kU/L
Allergen, Cedar tree, t12: <0.1 kU/L
Allergen, Comm Silver Birch, t9: <0.1 kU/L
Allergen, D pternoyssinus,d7: <0.1 kU/L
Allergen, Mulberry, t76: <0.1 kU/L
Allergen, Oak,t7: <0.1 kU/L
Allergen, S. Botryosum, m10: <0.1 kU/L
Aspergillus fumigatus, m3: <0.1 kU/L
Bahia Grass: <0.1 kU/L
Box Elder IgE: <0.1 kU/L
Cockroach: <0.1 kU/L
D. farinae: <0.1 kU/L
Dog Dander: <0.1 kU/L
Nettle: <0.1 kU/L
Rough Pigweed  IgE: <0.1 kU/L

## 2016-11-22 DIAGNOSIS — T783XXA Angioneurotic edema, initial encounter: Secondary | ICD-10-CM | POA: Diagnosis not present

## 2016-12-15 DIAGNOSIS — M5441 Lumbago with sciatica, right side: Secondary | ICD-10-CM | POA: Diagnosis not present

## 2016-12-15 DIAGNOSIS — D259 Leiomyoma of uterus, unspecified: Secondary | ICD-10-CM | POA: Diagnosis not present

## 2016-12-15 DIAGNOSIS — N939 Abnormal uterine and vaginal bleeding, unspecified: Secondary | ICD-10-CM | POA: Diagnosis not present

## 2016-12-27 ENCOUNTER — Ambulatory Visit (INDEPENDENT_AMBULATORY_CARE_PROVIDER_SITE_OTHER): Payer: BLUE CROSS/BLUE SHIELD | Admitting: Allergy & Immunology

## 2016-12-27 ENCOUNTER — Encounter (INDEPENDENT_AMBULATORY_CARE_PROVIDER_SITE_OTHER): Payer: Self-pay

## 2016-12-27 ENCOUNTER — Encounter: Payer: Self-pay | Admitting: Allergy & Immunology

## 2016-12-27 VITALS — BP 120/70 | HR 78 | Temp 97.6°F | Resp 18 | Ht 63.98 in | Wt 197.3 lb

## 2016-12-27 DIAGNOSIS — R21 Rash and other nonspecific skin eruption: Secondary | ICD-10-CM

## 2016-12-27 DIAGNOSIS — J302 Other seasonal allergic rhinitis: Secondary | ICD-10-CM | POA: Diagnosis not present

## 2016-12-27 DIAGNOSIS — J3089 Other allergic rhinitis: Secondary | ICD-10-CM | POA: Diagnosis not present

## 2016-12-27 MED ORDER — AZELASTINE HCL 0.15 % NA SOLN: 2.0000 | Freq: Two times a day (BID) | NASAL | 5 refills | Status: DC

## 2016-12-27 NOTE — Progress Notes (Signed)
FOLLOW UP  Date of Service/Encounter:  12/27/16   Assessment:   Rash  Seasonal and perennial allergic rhinitis (weeds, grasses, molds, dust mites, cat, dog and cockroach)   Plan/Recommendations:   1. Rash - Testing today showed: weeds, grasses, molds, dust mites, cat, dog and cockroach - Avoidance measures provided. - Continue with Zyrtec (cetirizine) 10mg  twice daily - Start Astelin (azelastine) 2 sprays per nostril 1-2 times daily as needed - You can use an extra dose of the antihistamine, if needed, for breakthrough symptoms.  - Consider nasal saline rinses 1-2 times daily to remove allergens from the nasal cavities as well as help with mucous clearance (this is especially helpful to do before the nasal sprays are given) - Consider allergy shots as a means of long-term control. - Allergy shots "re-train" and "reset" the immune system to ignore environmental allergens and decrease the resulting immune response to those allergens (sneezing, itchy watery eyes, runny nose, nasal congestion, etc).    - Allergy shots improve symptoms in 75-85% of patients.  - However, I am not convinced that these have anything to do with the rash.  - Summer months have elevated grass pollens, so perhaps that is contributing to this as well.  - Continue with Eucrisa twice daily as needed for the facial rash.   2. Return in about 3 months (around 03/28/2017).  Subjective:   Tamiyah Moulin is a 51 y.o. female presenting today for follow up of  Chief Complaint  Patient presents with  . Allergy Testing    Trixie Maclaren has a history of the following: Patient Active Problem List   Diagnosis Date Noted  . Essential hypertension 09/30/2016  . Gastroesophageal reflux disease 02/16/2016  . Menopause 02/16/2016  . Hyperlipidemia 10/06/2014  . Vitamin D deficiency 08/20/2014  . GAD (generalized anxiety disorder) 07/10/2014    History obtained from: chart review and patient  [facility] Primary  Care Provider is Junie Spencer, FNP.     Ivan is a 51 y.o. female presenting for a skin testing. She was last seen in July 2018 for evaluation of a rash. At the time, I felt that this was more related to a likely contact dermatitis, although the seasonality of pointed towards a seasonal allergic trigger. We did get some lab work that included a negative environmental allergen panel as well as normal inflammatory markers and an ANA. I recommended doubling her cetirizine dosing. We added an eyedrop to help with ocular allergic symptoms and added Eucrsia to help with the rash as needed. Since he environmental allergen panel was negative, we offered skin testing versus patch testing to the patient and she decided to go with skin testing.  Since the last visit, she has done well. She has not used any drops she was afraid they would conflict with her glaucoma eyedrops. The rash has not returned, therefore she has not used [place]. She has had some recent stresses in her life including her son evidently overdosed and had strokes. He is now recovering. Because of the stress, she has started smoking once again but is determined to stop smoking.  Of note, she does report that in retrospect, she had one occurrence of the rash in February. However the majority of the episodes are in the summer months. She denies changes in her work environment at that time. She does not think that she can get the information on the chemicals at her work, although I did inform her that they are required by law  to provide MSDS information on all chemicals to employees.    Otherwise, there have been no changes to her past medical history, surgical history, family history, or social history.    Review of Systems: a 14-point review of systems is pertinent for what is mentioned in HPI.  Otherwise, all other systems were negative. Constitutional: negative other than that listed in the HPI Eyes: negative other than that listed in the  HPI Ears, nose, mouth, throat, and face: negative other than that listed in the HPI Respiratory: negative other than that listed in the HPI Cardiovascular: negative other than that listed in the HPI Gastrointestinal: negative other than that listed in the HPI Genitourinary: negative other than that listed in the HPI Integument: negative other than that listed in the HPI Hematologic: negative other than that listed in the HPI Musculoskeletal: negative other than that listed in the HPI Neurological: negative other than that listed in the HPI Allergy/Immunologic: negative other than that listed in the HPI    Objective:   Blood pressure 120/70, pulse 78, temperature 97.6 F (36.4 C), temperature source Oral, resp. rate 18, height 5' 3.98" (1.625 m), weight 197 lb 4.8 oz (89.5 kg), SpO2 97 %. Body mass index is 33.89 kg/m.   Physical Exam:  General: Alert, interactive, in no acute distress. Pleasant.  Eyes: No conjunctival injection present on the right, No conjunctival injection present on the left, PERRL bilaterally, No discharge on the right, No discharge on the left and No Horner-Trantas dots present Lungs: Clear to auscultation without wheezing, rhonchi or rales. No increased work of breathing. CV: Normal S1/S2, no murmurs. Capillary refill <2 seconds.  Skin: Warm and dry, without lesions or rashes. Neuro:   Grossly intact. No focal deficits appreciated. Responsive to questions.   Diagnostic studies:   Allergy Studies:   Indoor/Outdoor Percutaneous Adult Environmental Panel: positive to Df mite, Dp mites and tobacco. Otherwise negative with adequate controls.  Indoor/Outdoor Selected Intradermal Environmental Panel: positive to [employer] grass, Grass mix, ragweed mix, mold mix #1, mold mix #3, mold mix #4, cat, dog and cockroach. Otherwise negative with adequate controls.   Malachi BondsJoel Adline Kirshenbaum, MD FAAAAI [facility]

## 2016-12-27 NOTE — Patient Instructions (Addendum)
1. Rash - Testing today showed: weeds, grasses, molds, dust mites, cat, dog and cockroach - Avoidance measures provided. - Continue with Zyrtec (cetirizine) 10mg  twice daily - Start Astelin (azelastine) 2 sprays per nostril 1-2 times daily as needed - You can use an extra dose of the antihistamine, if needed, for breakthrough symptoms.  - Consider nasal saline rinses 1-2 times daily to remove allergens from the nasal cavities as well as help with mucous clearance (this is especially helpful to do before the nasal sprays are given) - Consider allergy shots as a means of long-term control. - Allergy shots "re-train" and "reset" the immune system to ignore environmental allergens and decrease the resulting immune response to those allergens (sneezing, itchy watery eyes, runny nose, nasal congestion, etc).    - Allergy shots improve symptoms in 75-85% of patients.  - However, I am not convinced that these have anything to do with the rash.  - Continue with Eucrisa twice daily as needed for the facial rash.   2. Return in about 3 months (around 03/28/2017).  Please inform us of any Emergency Department visits, hospitalizations, or changes in symptoms. Call us before going to the ED for breathing or allergy symptoms since we might be able to fit you in for a sick visit. Feel free to contact us anytime with any questions, problems, or concerns.  It was a pleasure to meet you today! Happy summer!   Websites that have reliable patient information: 1. American Academy of Asthma, Allergy, and Immunology: www.aaaai.org 2. Food Allergy Research and Education (FARE): foodallergy.org 3. Mothers of Asthmatics: http://www.asthmacommunitynetwork.org 4. American College of Allergy, Asthma, and Immunology: www.acaai.org  Reducing Pollen Exposure  The American Academy of Allergy, Asthma and Immunology suggests the following steps to reduce your exposure to pollen during allergy seasons.    1. Do not hang  sheets or clothing out to dry; pollen may collect on these items. 2. Do not mow lawns or spend time around freshly cut grass; mowing stirs up pollen. 3. Keep windows closed at night.  Keep car windows closed while driving. 4. Minimize morning activities outdoors, a time when pollen counts are usually at their highest. 5. Stay indoors as much as possible when pollen counts or humidity is high and on windy days when pollen tends to remain in the air longer. 6. Use air conditioning when possible.  Many air conditioners have filters that trap the pollen spores. 7. Use a HEPA room air filter to remove pollen form the indoor air you breathe.  Control of Mold Allergen  Mold and fungi can grow on a variety of surfaces provided certain temperature and moisture conditions exist.  Outdoor molds grow on plants, decaying vegetation and soil.  The major outdoor mold, Alternaria and Cladosporium, are found in very high numbers during hot and dry conditions.  Generally, a late Summer - Fall peak is seen for common outdoor fungal spores.  Rain will temporarily lower outdoor mold spore count, but counts rise rapidly when the rainy period ends.  The most important indoor molds are Aspergillus and Penicillium.  Dark, humid and poorly ventilated basements are ideal sites for mold growth.  The next most common sites of mold growth are the bathroom and the kitchen.  Outdoor [employer] 1. Use air conditioning and keep windows closed 2. Avoid exposure to decaying vegetation. 3. Avoid leaf raking. 4. Avoid grain handling. 5. Consider wearing a face mask if working in moldy areas.  Indoor Mold Control 1. Maintain humidity  below 50%. 2. Clean washable surfaces with 5% bleach solution. 3. Remove sources e.g. contaminated carpets.  Control of House Dust Mite Allergen    House dust mites play a major role in allergic asthma and rhinitis.  They occur in environments with high humidity wherever human skin, the food for  dust mites is found. High levels have been detected in dust obtained from mattresses, pillows, carpets, upholstered furniture, bed covers, clothes and soft toys.  The principal allergen of the house dust mite is found in its feces.  A gram of dust may contain 1,000 mites and 250,000 fecal particles.  Mite antigen is easily measured in the air during house cleaning activities.    1. Encase mattresses, including the box spring, and pillow, in an air tight cover.  Seal the zipper end of the encased mattresses with wide adhesive tape. 2. Wash the bedding in water of 130 degrees Farenheit weekly.  Avoid cotton comforters/quilts and flannel bedding: the most ideal bed covering is the dacron comforter. 3. Remove all upholstered furniture from the bedroom. 4. Remove carpets, carpet padding, rugs, and non-washable window drapes from the bedroom.  Wash drapes weekly or use plastic window coverings. 5. Remove all non-washable stuffed toys from the bedroom.  Wash stuffed toys weekly. 6. Have the room cleaned frequently with a vacuum cleaner and a damp dust-mop.  The patient should not be in a room which is being cleaned and should wait 1 hour after cleaning before going into the room. 7. Close and seal all heating outlets in the bedroom.  Otherwise, the room will become filled with dust-laden air.  An electric heater can be used to heat the room. 8. Reduce indoor humidity to less than 50%.  Do not use a humidifier.  Control of Cockroach Allergen  Cockroach allergen has been identified as an important cause of acute attacks of asthma, especially in urban settings.  There are fifty-five species of cockroach that exist in the [place], however only three, the [place], [place] and Oriental species produce allergen that can affect patients with Asthma.  Allergens can be obtained from fecal particles, egg casings and secretions from cockroaches.    1. Remove food sources. 2. Reduce access to water. 3. Seal  access and entry points. 4. Spray runways with 0.5-1% Diazinon or Chlorpyrifos 5. Blow boric acid power under stoves and refrigerator. 6. Place bait stations (hydramethylnon) at feeding sites.  Control of Dog or Cat Allergen  Avoidance is the best way to manage a dog or cat allergy. If you have a dog or cat and are allergic to dog or cats, consider removing the dog or cat from the home. If you have a dog or cat but don't want to find it a new home, or if your family wants a pet even though someone in the household is allergic, here are some strategies that may help keep symptoms at bay:  1. Keep the pet out of your bedroom and restrict it to only a few rooms. Be advised that keeping the dog or cat in only one room will not limit the allergens to that room. 2. Don't pet, hug or kiss the dog or cat; if you do, wash your hands with soap and water. 3. High-efficiency particulate air (HEPA) cleaners run continuously in a bedroom or living room can reduce allergen levels over time. 4. Regular use of a high-efficiency vacuum cleaner or a central vacuum can reduce allergen levels. 5. Giving your dog or cat a bath  at least once a week can reduce airborne allergen.

## 2017-02-14 ENCOUNTER — Ambulatory Visit: Payer: BLUE CROSS/BLUE SHIELD | Admitting: Allergy & Immunology

## 2017-03-21 DIAGNOSIS — J0110 Acute frontal sinusitis, unspecified: Secondary | ICD-10-CM | POA: Diagnosis not present

## 2017-03-27 ENCOUNTER — Other Ambulatory Visit: Payer: Self-pay | Admitting: Family

## 2017-03-28 ENCOUNTER — Ambulatory Visit: Payer: BLUE CROSS/BLUE SHIELD | Admitting: Allergy & Immunology

## 2017-03-28 ENCOUNTER — Telehealth: Payer: Self-pay | Admitting: Family

## 2017-03-28 MED ORDER — ALPRAZOLAM 0.25 MG PO TABS: 0.2500 mg | ORAL_TABLET | Freq: Two times a day (BID) | ORAL | 0 refills | Status: DC | PRN

## 2017-03-28 NOTE — Telephone Encounter (Signed)
Aware. 

## 2017-03-28 NOTE — Telephone Encounter (Signed)
Please call in Prescription sent to pharmacy

## 2017-03-28 NOTE — Telephone Encounter (Signed)
Patient aware, script is ready.  Called to [facility].

## 2017-03-31 ENCOUNTER — Ambulatory Visit: Payer: BLUE CROSS/BLUE SHIELD | Admitting: Nurse Practitioner

## 2017-03-31 ENCOUNTER — Encounter: Payer: Self-pay | Admitting: Nurse Practitioner

## 2017-03-31 VITALS — BP 134/81 | HR 69 | Temp 97.1°F | Ht 63.0 in | Wt 193.0 lb

## 2017-03-31 DIAGNOSIS — E785 Hyperlipidemia, unspecified: Secondary | ICD-10-CM

## 2017-03-31 DIAGNOSIS — F411 Generalized anxiety disorder: Secondary | ICD-10-CM | POA: Diagnosis not present

## 2017-03-31 DIAGNOSIS — I10 Essential (primary) hypertension: Secondary | ICD-10-CM | POA: Diagnosis not present

## 2017-03-31 DIAGNOSIS — E559 Vitamin D deficiency, unspecified: Secondary | ICD-10-CM

## 2017-03-31 DIAGNOSIS — K219 Gastro-esophageal reflux disease without esophagitis: Secondary | ICD-10-CM | POA: Diagnosis not present

## 2017-03-31 LAB — CMP14+EGFR
A/G RATIO: 1.8 (ref 1.2–2.2)
ALK PHOS: 87 IU/L (ref 39–117)
ALT: 19 IU/L (ref 0–32)
AST: 18 IU/L (ref 0–40)
Albumin: 4.4 g/dL (ref 3.5–5.5)
BUN/Creatinine Ratio: 8 — ABNORMAL LOW (ref 9–23)
BUN: 6 mg/dL (ref 6–24)
Bilirubin Total: <0.2 mg/dL (ref 0.0–1.2)
CO2: 25 mmol/L (ref 20–29)
Calcium: 9.5 mg/dL (ref 8.7–10.2)
Chloride: 101 mmol/L (ref 96–106)
Creatinine, Ser: 0.77 mg/dL (ref 0.57–1.00)
GFR calc Af Amer: 103 mL/min/{1.73_m2} (ref >59)
GFR calc non Af Amer: 89 mL/min/{1.73_m2} (ref >59)
GLOBULIN, TOTAL: 2.4 g/dL (ref 1.5–4.5)
Glucose: 95 mg/dL (ref 65–99)
POTASSIUM: 4 mmol/L (ref 3.5–5.2)
SODIUM: 138 mmol/L (ref 134–144)
Total Protein: 6.8 g/dL (ref 6.0–8.5)

## 2017-03-31 LAB — LIPID PANEL
CHOLESTEROL TOTAL: 157 mg/dL (ref 100–199)
Chol/HDL Ratio: 4.5 ratio — ABNORMAL HIGH (ref 0.0–4.4)
HDL: 35 mg/dL — AB (ref >39)
LDL Calculated: 89 mg/dL (ref 0–99)
TRIGLYCERIDES: 167 mg/dL — AB (ref 0–149)
VLDL Cholesterol Cal: 33 mg/dL (ref 5–40)

## 2017-03-31 MED ORDER — ATORVASTATIN CALCIUM 10 MG PO TABS: 10.0000 mg | ORAL_TABLET | Freq: Every day | ORAL | 1 refills | Status: DC

## 2017-03-31 MED ORDER — LOSARTAN POTASSIUM 100 MG PO TABS: 100.0000 mg | ORAL_TABLET | Freq: Every day | ORAL | 0 refills | Status: DC

## 2017-03-31 MED ORDER — LOSARTAN POTASSIUM 100 MG PO TABS: 100.0000 mg | ORAL_TABLET | Freq: Every day | ORAL | 1 refills | Status: DC

## 2017-03-31 NOTE — Progress Notes (Signed)
Subjective:    Patient ID: Lorraine Mcdaniel, female    DOB: 1965/12/29, 51 y.o.   MRN: [id number]  HPI  Layah Skousen is here today for follow up of chronic medical problem.  Outpatient Encounter Medications as of 03/31/2017  Medication Sig  . acetaminophen (TYLENOL) 500 MG tablet Take 1,000 mg by mouth every 6 (six) hours as needed for moderate pain.  Marland Kitchen ALPRAZolam (XANAX) 0.25 MG tablet Take 1 tablet (0.25 mg total) by mouth 2 (two) times daily as needed for anxiety.  Marland Kitchen amoxicillin (AMOXIL) 875 MG tablet   . atorvastatin (LIPITOR) 10 MG tablet Take 1 tablet (10 mg total) by mouth daily.  . Azelastine HCl 0.15 % SOLN Place 2 sprays into both nostrils 2 (two) times daily.  . cetirizine (ZYRTEC) 10 MG tablet Take 1 tablet (10 mg total) by mouth 2 (two) times daily.  Stasia Cavalier (EUCRISA) 2 % OINT Apply 1 application topically 2 (two) times daily as needed.  . diphenhydrAMINE (BENADRYL) 25 MG tablet Take 1 tablet (25 mg total) by mouth every 6 (six) hours as needed. (Patient taking differently: Take 25 mg by mouth every 6 (six) hours as needed. Take 2 tablets every 6 hours as needed)  . EPINEPHrine 0.3 mg/0.3 mL IJ SOAJ injection Inject 0.3 mg into the muscle once.  . latanoprost (XALATAN) 0.005 % ophthalmic solution   . losartan (COZAAR) 100 MG tablet Take 1 tablet (100 mg total) by mouth daily.  . Olopatadine HCl (PAZEO) 0.7 % SOLN Place 1 drop into both eyes 2 (two) times daily as needed.  . benzonatate (TESSALON) 100 MG capsule   . Vitamin D, Ergocalciferol, (DRISDOL) 50000 units CAPS capsule Take 1 capsule (50,000 Units total) by mouth every 7 (seven) days.    1. Essential hypertension  No c/o chest pain, sob or headache. Does not check blood pressure at home. BP Readings from Last 3 Encounters:  03/31/17 134/81  12/27/16 120/70  11/10/16 118/70     2. Gastroesophageal reflux disease, esophagitis presence not specified  currently not on anything. She takes otc meds if needed  3.  GAD (generalized anxiety disorder)  Patient is very stressed today. Son passed away this past 07-17-2022 from what they are thinking is a drug overdose. Waiting on autopsy report. Patients mom found him dead in his bed- 96years old. Patient has custody of his children which is a help.  4. Vitamin D deficiency  OTC vitamin d daily  5. Hyperlipidemia, unspecified hyperlipidemia type  Not watching diet    New complaints: None other then the death of her son mentioned earlier  Social history: She works at [employer]: Negative for activity change and appetite change.  HENT: Negative.   Eyes: Negative for pain.  Respiratory: Negative for shortness of breath.   Cardiovascular: Negative for chest pain, palpitations and leg swelling.  Gastrointestinal: Negative for abdominal pain.  Endocrine: Negative for polydipsia.  Genitourinary: Negative.   Skin: Negative for rash.  Neurological: Negative for dizziness, weakness and headaches.  Hematological: Does not bruise/bleed easily.  Psychiatric/Behavioral: Negative.   All other systems reviewed and are negative.      Objective:   Physical Exam  Constitutional: She is oriented to person, place, and time. She appears well-developed and well-nourished.  HENT:  Nose: Nose normal.  Mouth/Throat: Oropharynx is clear and moist.  Eyes: EOM are normal.  Neck: Trachea normal, normal range of motion and full passive range of motion without  pain. Neck supple. No JVD present. Carotid bruit is not present. No thyromegaly present.  Cardiovascular: Normal rate, regular rhythm, normal heart sounds and intact distal pulses. Exam reveals no gallop and no friction rub.  No murmur heard. Pulmonary/Chest: Effort normal and breath sounds normal.  Abdominal: Soft. Bowel sounds are normal. She exhibits no distension and no mass. There is no tenderness.  Musculoskeletal: Normal range of motion.  Lymphadenopathy:    She has no  cervical adenopathy.  Neurological: She is alert and oriented to person, place, and time. She has normal reflexes.  Skin: Skin is warm and dry.  Psychiatric: She has a normal mood and affect. Her behavior is normal. Judgment and thought content normal.   BP 134/81   Pulse 69   Temp (!) 97.1 F (36.2 C) (Oral)   Ht '5\' 3"'$  (1.6 m)   Wt 193 lb (87.5 kg)   BMI 34.19 kg/m      Assessment & Plan:  1. Essential hypertension Low sodium diet - losartan (COZAAR) 100 MG tablet; Take 1 tablet (100 mg total) by mouth daily.  Dispense: 90 tablet; Refill: 0 - CMP14+EGFR  2. Gastroesophageal reflux disease, esophagitis presence not specified Avoid spicy foods Do not eat 2 hours prior to bedtime  3. GAD (generalized anxiety disorder) Stress management Take xanax rx earlier this week as needed  4. Vitamin D deficiency  5. Hyperlipidemia, unspecified hyperlipidemia type Low fat diet - Lipid panel - atorvastatin (LIPITOR) 10 MG tablet; Take 1 tablet (10 mg total) by mouth daily.  Dispense: 90 tablet; Refill: 1    Labs pending Health maintenance reviewed Diet and exercise encouraged Continue all meds Follow up  In 6 months   [place], FNP

## 2017-03-31 NOTE — Patient Instructions (Signed)

## 2017-03-31 NOTE — Addendum Note (Signed)
Addended by: Bennie PieriniMARTIN, MARY-MARGARET on: 03/31/2017 11:40 AM   Modules accepted: Orders

## 2017-04-03 ENCOUNTER — Ambulatory Visit: Payer: BLUE CROSS/BLUE SHIELD | Admitting: Nurse Practitioner

## 2017-05-02 DIAGNOSIS — F17210 Nicotine dependence, cigarettes, uncomplicated: Secondary | ICD-10-CM | POA: Diagnosis not present

## 2017-05-02 DIAGNOSIS — Z716 Tobacco abuse counseling: Secondary | ICD-10-CM | POA: Diagnosis not present

## 2017-05-02 DIAGNOSIS — F4321 Adjustment disorder with depressed mood: Secondary | ICD-10-CM | POA: Diagnosis not present

## 2017-05-25 DIAGNOSIS — H40023 Open angle with borderline findings, high risk, bilateral: Secondary | ICD-10-CM | POA: Diagnosis not present

## 2017-06-14 ENCOUNTER — Ambulatory Visit: Payer: BLUE CROSS/BLUE SHIELD | Admitting: Pediatrics

## 2017-06-14 ENCOUNTER — Encounter: Payer: Self-pay | Admitting: Pediatrics

## 2017-06-14 VITALS — BP 117/82 | HR 90 | Temp 97.6°F | Ht 63.0 in | Wt 190.6 lb

## 2017-06-14 DIAGNOSIS — J069 Acute upper respiratory infection, unspecified: Secondary | ICD-10-CM

## 2017-06-14 DIAGNOSIS — J020 Streptococcal pharyngitis: Secondary | ICD-10-CM | POA: Diagnosis not present

## 2017-06-14 DIAGNOSIS — J029 Acute pharyngitis, unspecified: Secondary | ICD-10-CM | POA: Diagnosis not present

## 2017-06-14 MED ORDER — AMOXICILLIN 500 MG PO CAPS: 500.0000 mg | ORAL_CAPSULE | Freq: Two times a day (BID) | ORAL | 0 refills | Status: AC

## 2017-06-14 NOTE — Patient Instructions (Signed)
Fever reducer and headache: tylenol and ibuprofen, can take together or alternating   Sinus pressure:  Nasal steroid such as flonase/fluticaone or nasocort daily Can also take daily antihistamine such as loratadine/claritin or cetirizine/zyrtec  Sinus rinses/irritation: Netipot or similar with distilled water 2-3 times a day to clear out sinuses or Normal saline nasal spray  Sore throat:  Throat lozenges chloroseptic spray  Stick with bland foods Drink lots of fluids  

## 2017-06-14 NOTE — Progress Notes (Signed)
  Subjective:   Patient ID: Lorraine Mcdaniel, female    DOB: 1966/03/04, 52 y.o.   MRN: [id number] CC: Cough (no fever   )  HPI: Lorraine Mcdaniel is a 52 y.o. female presenting for Cough (no fever   )  Throat has been very sore. Grandson recently with strep throat. No fevers. Appetite down. Cough bothering her. No wheezing.   Relevant past medical, surgical, family and social history reviewed. Allergies and medications reviewed and updated. Social History   Tobacco Use  Smoking Status Former Smoker  . Packs/day: 0.75  . Types: Cigarettes  . Start date: 11/19/1970  . Last attempt to quit: 12/29/2015  . Years since quitting: 1.4  Smokeless Tobacco Never Used   ROS: Per HPI   Objective:    BP 117/82   Pulse 90   Temp 97.6 F (36.4 C) (Oral)   Ht 5\' 3"  (1.6 m)   Wt 190 lb 9.6 oz (86.5 kg)   BMI 33.76 kg/m   Wt Readings from Last 3 Encounters:  06/14/17 190 lb 9.6 oz (86.5 kg)  03/31/17 193 lb (87.5 kg)  12/27/16 197 lb 4.8 oz (89.5 kg)    Gen: NAD, alert, cooperative with exam, NCAT, congested, coughing EYES: EOMI, no conjunctival injection, or no icterus ENT:  TMs pearly gray b/l, OP with erythema LYMPH: no cervical LAD CV: NRRR, normal S1/S2, no murmur, distal pulses 2+ b/l Resp: CTABL, no wheezes, normal WOB Ext: No edema, warm Neuro: Alert and oriented, strength equal b/l UE and LE, coordination grossly normal MSK: normal muscle bulk  Assessment & Plan:  Lorraine Mcdaniel was seen today for cough.  Diagnoses and all orders for this visit:  Acute URI   Sore throat -     Rapid Strep A  Strep pharyngitis Strep positive, treat with below. Symptoms care, return precautions discussed. -     amoxicillin (AMOXIL) 500 MG capsule; Take 1 capsule (500 mg total) by mouth 2 (two) times daily for 10 days.   Follow up plan: prn Rex Krasarol Beckem Tomberlin, MD [facility]

## 2017-06-15 LAB — RAPID STREP SCREEN (MED CTR MEBANE ONLY): STREP GP A AG, IA W/REFLEX: POSITIVE — AB

## 2017-06-21 DIAGNOSIS — H40023 Open angle with borderline findings, high risk, bilateral: Secondary | ICD-10-CM | POA: Diagnosis not present

## 2017-07-07 DIAGNOSIS — Z716 Tobacco abuse counseling: Secondary | ICD-10-CM | POA: Diagnosis not present

## 2017-07-07 DIAGNOSIS — F17210 Nicotine dependence, cigarettes, uncomplicated: Secondary | ICD-10-CM | POA: Diagnosis not present

## 2017-07-16 ENCOUNTER — Other Ambulatory Visit: Payer: Self-pay | Admitting: Allergy & Immunology

## 2017-07-16 DIAGNOSIS — L209 Atopic dermatitis, unspecified: Secondary | ICD-10-CM

## 2017-07-18 DIAGNOSIS — F17210 Nicotine dependence, cigarettes, uncomplicated: Secondary | ICD-10-CM | POA: Diagnosis not present

## 2017-07-18 DIAGNOSIS — Z719 Counseling, unspecified: Secondary | ICD-10-CM | POA: Diagnosis not present

## 2017-07-18 DIAGNOSIS — Z008 Encounter for other general examination: Secondary | ICD-10-CM | POA: Diagnosis not present

## 2017-07-18 DIAGNOSIS — E785 Hyperlipidemia, unspecified: Secondary | ICD-10-CM | POA: Diagnosis not present

## 2017-07-18 DIAGNOSIS — K219 Gastro-esophageal reflux disease without esophagitis: Secondary | ICD-10-CM | POA: Diagnosis not present

## 2017-07-18 DIAGNOSIS — Z716 Tobacco abuse counseling: Secondary | ICD-10-CM | POA: Diagnosis not present

## 2017-07-18 DIAGNOSIS — R7301 Impaired fasting glucose: Secondary | ICD-10-CM | POA: Diagnosis not present

## 2017-08-28 ENCOUNTER — Other Ambulatory Visit: Payer: Self-pay | Admitting: Allergy & Immunology

## 2017-08-28 DIAGNOSIS — L209 Atopic dermatitis, unspecified: Secondary | ICD-10-CM

## 2017-08-29 DIAGNOSIS — H1032 Unspecified acute conjunctivitis, left eye: Secondary | ICD-10-CM | POA: Diagnosis not present

## 2017-08-30 DIAGNOSIS — M542 Cervicalgia: Secondary | ICD-10-CM | POA: Diagnosis not present

## 2017-08-30 DIAGNOSIS — R42 Dizziness and giddiness: Secondary | ICD-10-CM | POA: Diagnosis not present

## 2017-09-09 ENCOUNTER — Other Ambulatory Visit: Payer: Self-pay | Admitting: Nurse Practitioner

## 2017-09-09 DIAGNOSIS — I10 Essential (primary) hypertension: Secondary | ICD-10-CM

## 2017-09-11 ENCOUNTER — Other Ambulatory Visit: Payer: Self-pay | Admitting: Nurse Practitioner

## 2017-09-11 DIAGNOSIS — E785 Hyperlipidemia, unspecified: Secondary | ICD-10-CM

## 2017-09-12 DIAGNOSIS — Z716 Tobacco abuse counseling: Secondary | ICD-10-CM | POA: Diagnosis not present

## 2017-09-12 DIAGNOSIS — Z139 Encounter for screening, unspecified: Secondary | ICD-10-CM | POA: Diagnosis not present

## 2017-09-12 DIAGNOSIS — Z79899 Other long term (current) drug therapy: Secondary | ICD-10-CM | POA: Diagnosis not present

## 2017-09-12 DIAGNOSIS — F17210 Nicotine dependence, cigarettes, uncomplicated: Secondary | ICD-10-CM | POA: Diagnosis not present

## 2017-09-12 DIAGNOSIS — E785 Hyperlipidemia, unspecified: Secondary | ICD-10-CM | POA: Diagnosis not present

## 2017-09-12 DIAGNOSIS — E559 Vitamin D deficiency, unspecified: Secondary | ICD-10-CM | POA: Diagnosis not present

## 2017-09-12 DIAGNOSIS — H04123 Dry eye syndrome of bilateral lacrimal glands: Secondary | ICD-10-CM | POA: Diagnosis not present

## 2017-09-15 ENCOUNTER — Ambulatory Visit: Payer: BLUE CROSS/BLUE SHIELD | Admitting: Family Medicine

## 2017-09-15 ENCOUNTER — Encounter: Payer: Self-pay | Admitting: Family Medicine

## 2017-09-15 VITALS — BP 120/74 | HR 83 | Temp 97.2°F | Ht 63.0 in | Wt 192.4 lb

## 2017-09-15 DIAGNOSIS — J0101 Acute recurrent maxillary sinusitis: Secondary | ICD-10-CM | POA: Diagnosis not present

## 2017-09-15 DIAGNOSIS — L209 Atopic dermatitis, unspecified: Secondary | ICD-10-CM | POA: Diagnosis not present

## 2017-09-15 DIAGNOSIS — F411 Generalized anxiety disorder: Secondary | ICD-10-CM

## 2017-09-15 MED ORDER — CETIRIZINE HCL 10 MG PO TABS: ORAL_TABLET | ORAL | 3 refills | Status: DC

## 2017-09-15 MED ORDER — FLUCONAZOLE 150 MG PO TABS: ORAL_TABLET | ORAL | 0 refills | Status: DC

## 2017-09-15 MED ORDER — AMOXICILLIN-POT CLAVULANATE 875-125 MG PO TABS: 1.0000 | ORAL_TABLET | Freq: Two times a day (BID) | ORAL | 0 refills | Status: DC

## 2017-09-15 MED ORDER — ALPRAZOLAM 0.25 MG PO TABS: 0.2500 mg | ORAL_TABLET | Freq: Two times a day (BID) | ORAL | 0 refills | Status: DC | PRN

## 2017-09-15 NOTE — Progress Notes (Signed)
   HPI  Patient presents today here with concern for sinus infection.  Patient also needs refill of Xanax and Zyrtec. Patient states that she quit smoking earlier this week and has been more stressed out, she is use Xanax a bit more than usual.  She her last refill was 5 months ago.  Illness Patient reports 1 day of nasal congestion cough sore throat, headache and facial pain. She states that this characteristic of previous sinus infections. She has had recurrent sinus infections in the past.   PMH: Smoking status noted ROS: Per HPI  Objective: BP 120/74   Pulse 83   Temp (!) 97.2 F (36.2 C) (Oral)   Ht  (1.6 m)   Wt 192 lb 6.4 oz (87.3 kg)   SpO2 96%   BMI 34.08 kg/m  Gen: NAD, alert, cooperative with exam HEENT: NCAT, oropharynx moist with mild erythema of the oropharynx, tenderness bilaterally of the maxillary sinuses, TMs normal bilaterally CV: RRR, good S1/S2, no murmur Resp: CTABL, no wheezes, non-labored Ext: No edema, warm Neuro: Alert and oriented, No gross deficits  Assessment and plan:  #Acute maxillary sinusitis This is a very early onset, however given that its recurrent and that the holiday weekend we will go ahead and treat. Augmentin Supportive care including Zyrtec which was refilled  #Anxiety Refilled Xanax small amount, recommended follow-up with PCP in 1 month.  Atopic derm- refilled zyrtec, no c/o active lesion   Meds ordered this encounter  Medications  . cetirizine (ZYRTEC) 10 MG tablet    Sig: TAKE 1 TABLET BY MOUTH TWICE DAILY MUST  HAVE  OFFICE  VISIT  FOR  ADDITIONAL  REFILLS    Dispense:  90 tablet    Refill:  3    Please consider 90 day supplies to promote better adherence  . ALPRAZolam (XANAX) 0.25 MG tablet    Sig: Take 1 tablet (0.25 mg total) by mouth 2 (two) times daily as needed for anxiety.    Dispense:  20 tablet    Refill:  0  . amoxicillin-clavulanate (AUGMENTIN) 875-125 MG tablet    Sig: Take 1 tablet by mouth 2  (two) times daily.    Dispense:  20 tablet    Refill:  0    Murtis Sink, MD [facility] 09/15/2017, 8:46 AM

## 2017-09-15 NOTE — Patient Instructions (Signed)
Great to see you!  Come back to see Lorraine Mcdaniel in about a month

## 2017-09-18 DIAGNOSIS — S32110A Nondisplaced Zone I fracture of sacrum, initial encounter for closed fracture: Secondary | ICD-10-CM | POA: Diagnosis not present

## 2017-09-18 DIAGNOSIS — S59901A Unspecified injury of right elbow, initial encounter: Secondary | ICD-10-CM | POA: Diagnosis not present

## 2017-10-10 DIAGNOSIS — Z716 Tobacco abuse counseling: Secondary | ICD-10-CM | POA: Diagnosis not present

## 2017-10-10 DIAGNOSIS — R7301 Impaired fasting glucose: Secondary | ICD-10-CM | POA: Diagnosis not present

## 2017-10-10 DIAGNOSIS — Z008 Encounter for other general examination: Secondary | ICD-10-CM | POA: Diagnosis not present

## 2017-10-10 DIAGNOSIS — E785 Hyperlipidemia, unspecified: Secondary | ICD-10-CM | POA: Diagnosis not present

## 2017-10-10 DIAGNOSIS — Z719 Counseling, unspecified: Secondary | ICD-10-CM | POA: Diagnosis not present

## 2017-10-10 DIAGNOSIS — F17210 Nicotine dependence, cigarettes, uncomplicated: Secondary | ICD-10-CM | POA: Diagnosis not present

## 2017-11-02 DIAGNOSIS — Z719 Counseling, unspecified: Secondary | ICD-10-CM | POA: Diagnosis not present

## 2017-11-02 DIAGNOSIS — Z008 Encounter for other general examination: Secondary | ICD-10-CM | POA: Diagnosis not present

## 2017-11-02 DIAGNOSIS — Z716 Tobacco abuse counseling: Secondary | ICD-10-CM | POA: Diagnosis not present

## 2017-11-02 DIAGNOSIS — F17210 Nicotine dependence, cigarettes, uncomplicated: Secondary | ICD-10-CM | POA: Diagnosis not present

## 2017-11-02 DIAGNOSIS — Z6832 Body mass index (BMI) 32.0-32.9, adult: Secondary | ICD-10-CM | POA: Diagnosis not present

## 2017-11-02 DIAGNOSIS — E785 Hyperlipidemia, unspecified: Secondary | ICD-10-CM | POA: Diagnosis not present

## 2017-11-24 ENCOUNTER — Telehealth: Payer: Self-pay | Admitting: Family

## 2017-11-24 DIAGNOSIS — Z1211 Encounter for screening for malignant neoplasm of colon: Secondary | ICD-10-CM

## 2017-11-24 NOTE — Telephone Encounter (Signed)
Cologard ordered and pt is aware.

## 2018-01-25 DIAGNOSIS — Z719 Counseling, unspecified: Secondary | ICD-10-CM | POA: Diagnosis not present

## 2018-01-25 DIAGNOSIS — F17210 Nicotine dependence, cigarettes, uncomplicated: Secondary | ICD-10-CM | POA: Diagnosis not present

## 2018-01-25 DIAGNOSIS — Z716 Tobacco abuse counseling: Secondary | ICD-10-CM | POA: Diagnosis not present

## 2018-01-25 DIAGNOSIS — E785 Hyperlipidemia, unspecified: Secondary | ICD-10-CM | POA: Diagnosis not present

## 2018-03-01 DIAGNOSIS — H04123 Dry eye syndrome of bilateral lacrimal glands: Secondary | ICD-10-CM | POA: Diagnosis not present

## 2018-03-10 ENCOUNTER — Other Ambulatory Visit: Payer: Self-pay | Admitting: Family

## 2018-03-10 DIAGNOSIS — E785 Hyperlipidemia, unspecified: Secondary | ICD-10-CM

## 2018-03-11 ENCOUNTER — Other Ambulatory Visit: Payer: Self-pay | Admitting: Nurse Practitioner

## 2018-03-11 DIAGNOSIS — I10 Essential (primary) hypertension: Secondary | ICD-10-CM

## 2018-04-10 DIAGNOSIS — F17210 Nicotine dependence, cigarettes, uncomplicated: Secondary | ICD-10-CM | POA: Diagnosis not present

## 2018-04-10 DIAGNOSIS — Z716 Tobacco abuse counseling: Secondary | ICD-10-CM | POA: Diagnosis not present

## 2018-04-10 DIAGNOSIS — Z7189 Other specified counseling: Secondary | ICD-10-CM | POA: Diagnosis not present

## 2018-05-22 DIAGNOSIS — Z716 Tobacco abuse counseling: Secondary | ICD-10-CM | POA: Diagnosis not present

## 2018-05-22 DIAGNOSIS — F17210 Nicotine dependence, cigarettes, uncomplicated: Secondary | ICD-10-CM | POA: Diagnosis not present

## 2018-05-30 ENCOUNTER — Other Ambulatory Visit: Payer: Self-pay | Admitting: Family

## 2018-05-30 DIAGNOSIS — E785 Hyperlipidemia, unspecified: Secondary | ICD-10-CM

## 2018-05-30 DIAGNOSIS — I10 Essential (primary) hypertension: Secondary | ICD-10-CM

## 2018-05-30 NOTE — Telephone Encounter (Signed)
Last seen 09/15/17 Dr Kathlene November PCP

## 2018-06-06 ENCOUNTER — Ambulatory Visit: Payer: BLUE CROSS/BLUE SHIELD | Admitting: Family Medicine

## 2018-06-07 ENCOUNTER — Ambulatory Visit: Payer: BLUE CROSS/BLUE SHIELD | Admitting: Family Medicine

## 2018-06-07 ENCOUNTER — Encounter: Payer: Self-pay | Admitting: Family Medicine

## 2018-06-07 VITALS — BP 141/82 | HR 88 | Temp 99.2°F | Ht 63.0 in | Wt 189.0 lb

## 2018-06-07 DIAGNOSIS — J0110 Acute frontal sinusitis, unspecified: Secondary | ICD-10-CM

## 2018-06-07 DIAGNOSIS — R509 Fever, unspecified: Secondary | ICD-10-CM

## 2018-06-07 LAB — CULTURE, GROUP A STREP

## 2018-06-07 LAB — RAPID STREP SCREEN (MED CTR MEBANE ONLY): Strep Gp A Ag, IA W/Reflex: NEGATIVE

## 2018-06-07 LAB — VERITOR FLU A/B WAIVED
INFLUENZA A: NEGATIVE
Influenza B: NEGATIVE

## 2018-06-07 MED ORDER — AMOXICILLIN-POT CLAVULANATE 875-125 MG PO TABS: 1.0000 | ORAL_TABLET | Freq: Two times a day (BID) | ORAL | 0 refills | Status: AC

## 2018-06-07 NOTE — Progress Notes (Signed)
    Subjective:     Lorraine Mcdaniel is a 53 y.o. female who presents for evaluation of symptoms of a URI, possible sinusitis. Symptoms include left ear pressure/pain, achiness, congestion, coryza, headache described as frontal pressure, low grade fever, nasal congestion, post nasal drip, productive cough with  white and yellow colored sputum, purulent nasal discharge, sinus pressure, sneezing and sore throat. Onset of symptoms was 5 days ago, and has been unchanged since that time. Treatment to date: cough suppressants and decongestants.  The following portions of the patient's history were reviewed and updated as appropriate: allergies, current medications, past family history, past medical history, past social history, past surgical history and problem list.  Review of Systems Constitutional: positive for chills, fatigue, fevers and malaise Eyes: negative Ears, nose, mouth, throat, and face: positive for earaches, nasal congestion, sore throat and sinus pressure, postnasal drainage Respiratory: positive for cough and sputum Cardiovascular: negative Gastrointestinal: negative Musculoskeletal:positive for myalgias Neurological: positive for headaches   Objective:    BP (!) 141/82   Pulse 88   Temp 99.2 F (37.3 C) (Oral)   Ht 5\' 3"  (1.6 m)   Wt 189 lb (85.7 kg)   BMI 33.48 kg/m  General appearance: alert, cooperative and mild distress Head: Normocephalic, without obvious abnormality, atraumatic Eyes: negative Ears: abnormal TM right ear - serous middle ear fluid and abnormal TM left ear - serous middle ear fluid Nose: purulent and scant discharge, moderate congestion, turbinates red, swollen, moderate frontal sinus tenderness bilateral Throat: abnormal findings: moderate oropharyngeal erythema and tonsillar hypertrophy 3+ Neck: mild anterior cervical adenopathy, no carotid bruit, no JVD, supple, symmetrical, trachea midline and thyroid not enlarged, symmetric, no  tenderness/mass/nodules Lungs: clear to auscultation bilaterally Heart: regular rate and rhythm, S1, S2 normal, no murmur, click, rub or gallop Skin: Skin color, texture, turgor normal. No rashes or lesions Neurologic: Grossly normal    Rapid strep negative in office. Influenza negative in office.  Assessment:     Girlene was seen today for uri.  Diagnoses and all orders for this visit:  Acute non-recurrent frontal sinusitis Symptomatic care discussed. Medications as prescribed. Report any new or worsening symptoms.  -     amoxicillin-clavulanate (AUGMENTIN) 875-125 MG tablet; Take 1 tablet by mouth 2 (two) times daily for 10 days.  Fever, unspecified fever cause -     Veritor Flu A/B Waived -     Rapid Strep Screen ([facility] ONLY)     Plan:    Discussed the diagnosis and treatment of sinusitis. Suggested symptomatic OTC remedies. Nasal saline spray for congestion. Augmentin per orders. Follow up as needed. Call in 2 days if symptoms aren't resolving. Follow up with PCP in 2 weeks or as needed.    The above assessment and management plan was discussed with the patient. The patient verbalized understanding of and has agreed to the management plan. Patient is aware to call the clinic if symptoms fail to improve or worsen. Patient is aware when to return to the clinic for a follow-up visit. Patient educated on when it is appropriate to go to the emergency department.   Kari Baars, FNP-C [facility] [address] [place], [place] [address] [phone]

## 2018-06-07 NOTE — Patient Instructions (Signed)

## 2018-06-12 DIAGNOSIS — R7301 Impaired fasting glucose: Secondary | ICD-10-CM | POA: Diagnosis not present

## 2018-06-12 DIAGNOSIS — F17210 Nicotine dependence, cigarettes, uncomplicated: Secondary | ICD-10-CM | POA: Diagnosis not present

## 2018-06-12 DIAGNOSIS — E785 Hyperlipidemia, unspecified: Secondary | ICD-10-CM | POA: Diagnosis not present

## 2018-06-12 DIAGNOSIS — Z008 Encounter for other general examination: Secondary | ICD-10-CM | POA: Diagnosis not present

## 2018-06-12 DIAGNOSIS — Z716 Tobacco abuse counseling: Secondary | ICD-10-CM | POA: Diagnosis not present

## 2018-06-12 DIAGNOSIS — I10 Essential (primary) hypertension: Secondary | ICD-10-CM | POA: Diagnosis not present

## 2018-08-21 ENCOUNTER — Other Ambulatory Visit: Payer: Self-pay | Admitting: *Deleted

## 2018-08-21 DIAGNOSIS — L209 Atopic dermatitis, unspecified: Secondary | ICD-10-CM

## 2018-08-21 MED ORDER — CETIRIZINE HCL 10 MG PO TABS: 10.0000 mg | ORAL_TABLET | Freq: Two times a day (BID) | ORAL | 0 refills | Status: DC

## 2018-08-21 MED ORDER — CETIRIZINE HCL 10 MG PO TABS: 10.0000 mg | ORAL_TABLET | Freq: Two times a day (BID) | ORAL | 0 refills | Status: AC

## 2018-08-30 ENCOUNTER — Ambulatory Visit: Payer: BLUE CROSS/BLUE SHIELD | Admitting: Family

## 2018-08-30 ENCOUNTER — Encounter: Payer: Self-pay | Admitting: Family

## 2018-08-30 ENCOUNTER — Other Ambulatory Visit: Payer: Self-pay

## 2018-08-30 VITALS — BP 109/74 | HR 99 | Temp 97.5°F | Ht 63.0 in | Wt 190.0 lb

## 2018-08-30 DIAGNOSIS — Z0000 Encounter for general adult medical examination without abnormal findings: Secondary | ICD-10-CM

## 2018-08-30 DIAGNOSIS — Z1211 Encounter for screening for malignant neoplasm of colon: Secondary | ICD-10-CM

## 2018-08-30 DIAGNOSIS — Z0001 Encounter for general adult medical examination with abnormal findings: Secondary | ICD-10-CM | POA: Diagnosis not present

## 2018-08-30 DIAGNOSIS — F411 Generalized anxiety disorder: Secondary | ICD-10-CM | POA: Diagnosis not present

## 2018-08-30 DIAGNOSIS — I10 Essential (primary) hypertension: Secondary | ICD-10-CM | POA: Diagnosis not present

## 2018-08-30 DIAGNOSIS — E785 Hyperlipidemia, unspecified: Secondary | ICD-10-CM

## 2018-08-30 DIAGNOSIS — Z1212 Encounter for screening for malignant neoplasm of rectum: Secondary | ICD-10-CM

## 2018-08-30 DIAGNOSIS — K219 Gastro-esophageal reflux disease without esophagitis: Secondary | ICD-10-CM

## 2018-08-30 DIAGNOSIS — F17200 Nicotine dependence, unspecified, uncomplicated: Secondary | ICD-10-CM

## 2018-08-30 MED ORDER — ATORVASTATIN CALCIUM 10 MG PO TABS: 10.0000 mg | ORAL_TABLET | Freq: Every day | ORAL | 2 refills | Status: DC

## 2018-08-30 MED ORDER — LOSARTAN POTASSIUM 100 MG PO TABS: 100.0000 mg | ORAL_TABLET | Freq: Every day | ORAL | 2 refills | Status: DC

## 2018-08-30 MED ORDER — BUPROPION HCL ER (XL) 150 MG PO TB24: ORAL_TABLET | ORAL | 2 refills | Status: DC

## 2018-08-30 NOTE — Patient Instructions (Signed)

## 2018-08-30 NOTE — Progress Notes (Signed)
Subjective:    Patient ID: Lorraine Mcdaniel, female    DOB: Jan 29, 1966, 53 y.o.   MRN: [id number]  Chief Complaint  Patient presents with  . Medical Management of Chronic Issues  . Hypertension   Pt presents to the office today for CPE. She is followed by her NP at work every 3 months.  Hypertension  This is a chronic problem. The current episode started more than 1 year ago. The problem has been resolved since onset. The problem is controlled. Associated symptoms include anxiety and peripheral edema (in her ankles at times). Pertinent negatives include no headaches, malaise/fatigue or shortness of breath. Risk factors for coronary artery disease include dyslipidemia and sedentary lifestyle. The current treatment provides moderate improvement. There is no history of kidney disease, CAD/MI or heart failure.  Gastroesophageal Reflux  She complains of belching and globus sensation. This is a chronic problem. The current episode started more than 1 year ago. The problem occurs occasionally. She has tried an antacid for the symptoms. The treatment provided moderate relief.  Hyperlipidemia  This is a chronic problem. The current episode started more than 1 year ago. The problem is controlled. Recent lipid tests were reviewed and are normal. Exacerbating diseases include obesity. Pertinent negatives include no shortness of breath. Current antihyperlipidemic treatment includes statins. The current treatment provides moderate improvement of lipids. Risk factors for coronary artery disease include dyslipidemia, hypertension, a sedentary lifestyle and post-menopausal.  Anxiety  Presents for follow-up visit. Symptoms include decreased concentration, depressed mood, excessive worry, irritability, nervous/anxious behavior, panic and restlessness. Patient reports no shortness of breath. Symptoms occur occasionally. The severity of symptoms is moderate. The quality of sleep is good.        Review of Systems   Constitutional: Positive for irritability. Negative for malaise/fatigue.  Respiratory: Negative for shortness of breath.   Neurological: Negative for headaches.  Psychiatric/Behavioral: Positive for decreased concentration. The patient is nervous/anxious.   All other systems reviewed and are negative.  Family History  Problem Relation Age of Onset  . Heart disease Mother   . Arthritis Mother   . Stroke Father   . Hypertension Father   . Heart disease Father   . Cancer Sister        breast cancer in 57's   Social History   Socioeconomic History  . Marital status: Married    Spouse name: Not on file  . Number of children: Not on file  . Years of education: Not on file  . Highest education level: Not on file  Occupational History  . Not on file  Social Needs  . Financial resource strain: Not on file  . Food insecurity:    Worry: Not on file    Inability: Not on file  . Transportation needs:    Medical: Not on file    Non-medical: Not on file  Tobacco Use  . Smoking status: Former Smoker    Packs/day: 0.75    Types: Cigarettes    Start date: 11/23/2016  . Smokeless tobacco: Never Used  Substance and Sexual Activity  . Alcohol use: Yes    Comment: occasionally  . Drug use: No  . Sexual activity: Not on file  Lifestyle  . Physical activity:    Days per week: Not on file    Minutes per session: Not on file  . Stress: Not on file  Relationships  . Social connections:    Talks on phone: Not on file    Gets together: Not on  file    Attends religious service: Not on file    Active member of club or organization: Not on file    Attends meetings of clubs or organizations: Not on file    Relationship status: Not on file  . Intimate partner violence:    Fear of current or ex partner: Not on file    Emotionally abused: Not on file    Physically abused: Not on file    Forced sexual activity: Not on file  Other Topics Concern  . Not on file  Social History Narrative  .  Not on file       Objective:   Physical Exam Vitals signs reviewed.  Constitutional:      General: She is not in acute distress.    Appearance: She is well-developed.  HENT:     Head: Normocephalic and atraumatic.     Right Ear: Tympanic membrane normal.     Left Ear: Tympanic membrane normal.  Eyes:     Pupils: Pupils are equal, round, and reactive to light.  Neck:     Musculoskeletal: Normal range of motion and neck supple.     Thyroid: No thyromegaly.  Cardiovascular:     Rate and Rhythm: Normal rate and regular rhythm.     Heart sounds: Normal heart sounds. No murmur.  Pulmonary:     Effort: Pulmonary effort is normal. No respiratory distress.     Breath sounds: Normal breath sounds. No wheezing.  Abdominal:     General: Bowel sounds are normal. There is no distension.     Palpations: Abdomen is soft.     Tenderness: There is no abdominal tenderness.  Musculoskeletal: Normal range of motion.        General: No tenderness.  Skin:    General: Skin is warm and dry.  Neurological:     Mental Status: She is alert and oriented to person, place, and time.     Cranial Nerves: No cranial nerve deficit.     Deep Tendon Reflexes: Reflexes are normal and symmetric.  Psychiatric:        Behavior: Behavior normal.        Thought Content: Thought content normal.        Judgment: Judgment normal.       BP 109/74   Pulse 99   Temp (!) 97.5 F (36.4 C) (Oral)   Ht 5' 3" (1.6 m)   Wt 190 lb (86.2 kg)   BMI 33.66 kg/m      Assessment & Plan:  Lorraine Mcdaniel comes in today with chief complaint of Medical Management of Chronic Issues and Hypertension   Diagnosis and orders addressed:  1. Essential hypertension - CMP14+EGFR - CBC with Differential/Platelet - losartan (COZAAR) 100 MG tablet; Take 1 tablet (100 mg total) by mouth daily.  Dispense: 90 tablet; Refill: 2  2. GAD (generalized anxiety disorder) - CMP14+EGFR - CBC with Differential/Platelet - buPROPion  (WELLBUTRIN XL) 150 MG 24 hr tablet; TAKE 1 TABLET BY MOUTH ONCE DAILY IN THE MORNING FOR 30 DAYS  Dispense: 90 tablet; Refill: 2  3. Gastroesophageal reflux disease, esophagitis presence not specified - CMP14+EGFR - CBC with Differential/Platelet  4. Hyperlipidemia, unspecified hyperlipidemia type - CMP14+EGFR - CBC with Differential/Platelet - Lipid panel - atorvastatin (LIPITOR) 10 MG tablet; Take 1 tablet (10 mg total) by mouth daily.  Dispense: 90 tablet; Refill: 2  5. Current smoker - CMP14+EGFR - CBC with Differential/Platelet - buPROPion (WELLBUTRIN XL) 150 MG 24 hr tablet; TAKE  1 TABLET BY MOUTH ONCE DAILY IN THE MORNING FOR 30 DAYS  Dispense: 90 tablet; Refill: 2  6. Annual physical exam - CMP14+EGFR - CBC with Differential/Platelet - Lipid panel - TSH  7. Colon cancer screening - Cologuard  8. Screening for malignant neoplasm of the rectum - Cologuard   Labs pending Health Maintenance reviewed Diet and exercise encouraged  Follow up plan: 6 months    Evelina Dun, FNP

## 2018-08-31 LAB — CMP14+EGFR
ALT: 13 IU/L (ref 0–32)
AST: 15 IU/L (ref 0–40)
Albumin/Globulin Ratio: 1.7 (ref 1.2–2.2)
Albumin: 4.1 g/dL (ref 3.8–4.9)
Alkaline Phosphatase: 79 IU/L (ref 39–117)
BUN/Creatinine Ratio: 10 (ref 9–23)
BUN: 8 mg/dL (ref 6–24)
Bilirubin Total: <0.2 mg/dL (ref 0.0–1.2)
CO2: 22 mmol/L (ref 20–29)
Calcium: 9.4 mg/dL (ref 8.7–10.2)
Chloride: 105 mmol/L (ref 96–106)
Creatinine, Ser: 0.81 mg/dL (ref 0.57–1.00)
GFR calc Af Amer: 96 mL/min/{1.73_m2} (ref >59)
GFR calc non Af Amer: 83 mL/min/{1.73_m2} (ref >59)
Globulin, Total: 2.4 g/dL (ref 1.5–4.5)
Glucose: 105 mg/dL — ABNORMAL HIGH (ref 65–99)
Potassium: 4 mmol/L (ref 3.5–5.2)
Sodium: 140 mmol/L (ref 134–144)
Total Protein: 6.5 g/dL (ref 6.0–8.5)

## 2018-08-31 LAB — CBC WITH DIFFERENTIAL/PLATELET
Basophils Absolute: 0.1 10*3/uL (ref 0.0–0.2)
Basos: 1 %
EOS (ABSOLUTE): 0.2 10*3/uL (ref 0.0–0.4)
Eos: 2 %
Hematocrit: 37.9 % (ref 34.0–46.6)
Hemoglobin: 12.8 g/dL (ref 11.1–15.9)
Immature Grans (Abs): 0 10*3/uL (ref 0.0–0.1)
Immature Granulocytes: 0 %
Lymphocytes Absolute: 2.7 10*3/uL (ref 0.7–3.1)
Lymphs: 26 %
MCH: 27.4 pg (ref 26.6–33.0)
MCHC: 33.8 g/dL (ref 31.5–35.7)
MCV: 81 fL (ref 79–97)
Monocytes Absolute: 0.7 10*3/uL (ref 0.1–0.9)
Monocytes: 7 %
Neutrophils Absolute: 6.7 10*3/uL (ref 1.4–7.0)
Neutrophils: 64 %
Platelets: 373 10*3/uL (ref 150–450)
RBC: 4.68 x10E6/uL (ref 3.77–5.28)
RDW: 13 % (ref 11.7–15.4)
WBC: 10.4 10*3/uL (ref 3.4–10.8)

## 2018-08-31 LAB — LIPID PANEL
Chol/HDL Ratio: 6.1 ratio — ABNORMAL HIGH (ref 0.0–4.4)
Cholesterol, Total: 190 mg/dL (ref 100–199)
HDL: 31 mg/dL — ABNORMAL LOW (ref >39)
LDL Calculated: 115 mg/dL — ABNORMAL HIGH (ref 0–99)
Triglycerides: 221 mg/dL — ABNORMAL HIGH (ref 0–149)
VLDL Cholesterol Cal: 44 mg/dL — ABNORMAL HIGH (ref 5–40)

## 2018-08-31 LAB — TSH: TSH: 1.39 u[IU]/mL (ref 0.450–4.500)

## 2018-09-28 ENCOUNTER — Telehealth: Payer: Self-pay | Admitting: Family

## 2018-11-15 DIAGNOSIS — Z1231 Encounter for screening mammogram for malignant neoplasm of breast: Secondary | ICD-10-CM | POA: Diagnosis not present

## 2018-12-25 DIAGNOSIS — F17210 Nicotine dependence, cigarettes, uncomplicated: Secondary | ICD-10-CM | POA: Diagnosis not present

## 2018-12-25 DIAGNOSIS — Z716 Tobacco abuse counseling: Secondary | ICD-10-CM | POA: Diagnosis not present

## 2019-01-22 ENCOUNTER — Other Ambulatory Visit: Payer: Self-pay

## 2019-01-22 DIAGNOSIS — R6889 Other general symptoms and signs: Secondary | ICD-10-CM | POA: Diagnosis not present

## 2019-01-22 DIAGNOSIS — Z20822 Contact with and (suspected) exposure to covid-19: Secondary | ICD-10-CM

## 2019-01-24 LAB — NOVEL CORONAVIRUS, NAA: SARS-CoV-2, NAA: NOT DETECTED

## 2019-01-30 ENCOUNTER — Other Ambulatory Visit: Payer: Self-pay

## 2019-01-30 DIAGNOSIS — Z20828 Contact with and (suspected) exposure to other viral communicable diseases: Secondary | ICD-10-CM | POA: Diagnosis not present

## 2019-01-30 DIAGNOSIS — Z20822 Contact with and (suspected) exposure to covid-19: Secondary | ICD-10-CM

## 2019-02-01 LAB — NOVEL CORONAVIRUS, NAA: SARS-CoV-2, NAA: NOT DETECTED

## 2019-02-12 DIAGNOSIS — E785 Hyperlipidemia, unspecified: Secondary | ICD-10-CM | POA: Diagnosis not present

## 2019-02-12 DIAGNOSIS — I10 Essential (primary) hypertension: Secondary | ICD-10-CM | POA: Diagnosis not present

## 2019-02-12 DIAGNOSIS — R7301 Impaired fasting glucose: Secondary | ICD-10-CM | POA: Diagnosis not present

## 2019-02-12 DIAGNOSIS — Z79899 Other long term (current) drug therapy: Secondary | ICD-10-CM | POA: Diagnosis not present

## 2019-02-12 DIAGNOSIS — Z139 Encounter for screening, unspecified: Secondary | ICD-10-CM | POA: Diagnosis not present

## 2019-02-12 DIAGNOSIS — E559 Vitamin D deficiency, unspecified: Secondary | ICD-10-CM | POA: Diagnosis not present

## 2019-02-14 DIAGNOSIS — E559 Vitamin D deficiency, unspecified: Secondary | ICD-10-CM | POA: Diagnosis not present

## 2019-02-14 DIAGNOSIS — Z716 Tobacco abuse counseling: Secondary | ICD-10-CM | POA: Diagnosis not present

## 2019-02-14 DIAGNOSIS — F17210 Nicotine dependence, cigarettes, uncomplicated: Secondary | ICD-10-CM | POA: Diagnosis not present

## 2019-02-14 DIAGNOSIS — E785 Hyperlipidemia, unspecified: Secondary | ICD-10-CM | POA: Diagnosis not present

## 2019-03-26 DIAGNOSIS — Z716 Tobacco abuse counseling: Secondary | ICD-10-CM | POA: Diagnosis not present

## 2019-03-26 DIAGNOSIS — F17210 Nicotine dependence, cigarettes, uncomplicated: Secondary | ICD-10-CM | POA: Diagnosis not present

## 2019-05-06 ENCOUNTER — Telehealth: Payer: Self-pay | Admitting: Family

## 2019-05-06 DIAGNOSIS — J029 Acute pharyngitis, unspecified: Secondary | ICD-10-CM | POA: Diagnosis not present

## 2019-05-06 DIAGNOSIS — N644 Mastodynia: Secondary | ICD-10-CM | POA: Diagnosis not present

## 2019-05-06 DIAGNOSIS — R05 Cough: Secondary | ICD-10-CM | POA: Diagnosis not present

## 2019-05-06 DIAGNOSIS — Z6832 Body mass index (BMI) 32.0-32.9, adult: Secondary | ICD-10-CM | POA: Diagnosis not present

## 2019-05-06 NOTE — Telephone Encounter (Signed)
Patient called in for appointment today.  Had developed cough since yesterday.  Patient was told due to symptoms she would need to be a telephone visit.  Patient did not want this, wanted to be seen.

## 2019-07-17 DIAGNOSIS — Z029 Encounter for administrative examinations, unspecified: Secondary | ICD-10-CM

## 2019-09-23 ENCOUNTER — Other Ambulatory Visit: Payer: Self-pay | Admitting: Family

## 2019-09-23 DIAGNOSIS — I10 Essential (primary) hypertension: Secondary | ICD-10-CM

## 2019-11-19 DIAGNOSIS — Z1231 Encounter for screening mammogram for malignant neoplasm of breast: Secondary | ICD-10-CM | POA: Diagnosis not present

## 2019-12-01 ENCOUNTER — Emergency Department (HOSPITAL_COMMUNITY)
Admission: EM | Admit: 2019-12-01 | Discharge: 2019-12-01 | Disposition: A | Discharge status: Home / Self Care | Payer: BC Managed Care – PPO | Attending: Emergency Medicine | Admitting: Emergency Medicine

## 2019-12-01 ENCOUNTER — Other Ambulatory Visit: Payer: Self-pay

## 2019-12-01 ENCOUNTER — Encounter (HOSPITAL_COMMUNITY): Payer: Self-pay | Admitting: Emergency Medicine

## 2019-12-01 DIAGNOSIS — Z87891 Personal history of nicotine dependence: Secondary | ICD-10-CM | POA: Insufficient documentation

## 2019-12-01 DIAGNOSIS — L2381 Allergic contact dermatitis due to animal (cat) (dog) dander: Secondary | ICD-10-CM

## 2019-12-01 DIAGNOSIS — R21 Rash and other nonspecific skin eruption: Secondary | ICD-10-CM | POA: Diagnosis not present

## 2019-12-01 DIAGNOSIS — I10 Essential (primary) hypertension: Secondary | ICD-10-CM | POA: Insufficient documentation

## 2019-12-01 DIAGNOSIS — R220 Localized swelling, mass and lump, head: Secondary | ICD-10-CM | POA: Insufficient documentation

## 2019-12-01 DIAGNOSIS — T7840XA Allergy, unspecified, initial encounter: Secondary | ICD-10-CM | POA: Insufficient documentation

## 2019-12-01 DIAGNOSIS — Z79899 Other long term (current) drug therapy: Secondary | ICD-10-CM | POA: Diagnosis not present

## 2019-12-01 MED ORDER — DIPHENHYDRAMINE HCL 25 MG PO TABS: 25.0000 mg | ORAL_TABLET | Freq: Four times a day (QID) | ORAL | 0 refills | Status: DC | PRN

## 2019-12-01 MED ORDER — FAMOTIDINE 20 MG PO TABS
20.0000 mg | ORAL_TABLET | Freq: Two times a day (BID) | ORAL | 0 refills | Status: DC
Start: 2019-12-01 — End: 2021-09-16

## 2019-12-01 MED ORDER — METHYLPREDNISOLONE SODIUM SUCC 40 MG IJ SOLR
80.0000 mg | Freq: Once | INTRAMUSCULAR | Status: AC
  Administered 2019-12-01: 80 mg via INTRAVENOUS
  Filled 2019-12-01: qty 2

## 2019-12-01 MED ORDER — FAMOTIDINE IN NACL 20-0.9 MG/50ML-% IV SOLN
20.0000 mg | Freq: Once | INTRAVENOUS | Status: AC
  Administered 2019-12-01: 20 mg via INTRAVENOUS

## 2019-12-01 MED ORDER — DIPHENHYDRAMINE HCL 50 MG/ML IJ SOLN
25.0000 mg | Freq: Once | INTRAMUSCULAR | Status: AC
  Administered 2019-12-01: 25 mg via INTRAVENOUS
  Filled 2019-12-01: qty 1

## 2019-12-01 MED ORDER — EPINEPHRINE 0.3 MG/0.3ML IJ SOAJ: 0.3000 mg | INTRAMUSCULAR | 0 refills | Status: AC | PRN

## 2019-12-01 NOTE — Discharge Instructions (Addendum)
At this time there does not appear to be the presence of an emergent medical condition, however there is always the potential for conditions to change. Please read and follow the below instructions.  Please return to the Emergency Department immediately for any new or worsening symptoms. Please be sure to follow up with your Primary Care Provider within one week regarding your visit today; please call their office to schedule an appointment even if you are feeling better for a follow-up visit. Continue take the medications Benadryl and Pepcid as prescribed to help with your symptoms.  Benadryl and Pepcid may make you drowsy so do not drive, drink alcohol or drink alcohol or perform any dangerous activities while taking them.  Please avoid further allergen exposure.  Drink plenty water and get plenty of rest.  You are also prescribed an EpiPen today, you may use the EpiPen if you develop signs of anaphylaxis, if you use the EpiPen you must call 911 immediately return to the emergency department as the medication will wear off and the allergic reaction will return.  Get help right away if: You have a very bad headache. You have neck pain. Your neck is stiff. You throw up (vomit). You feel very sleepy. You see red streaks coming from the area. Your bone or joint near the area hurts after the skin has healed. The area turns darker. You have trouble breathing. You have any new/concerning or worsening of symptoms  Please read the additional information packets attached to your discharge summary.  Do not take your medicine if  develop an itchy rash, swelling in your mouth or lips, or difficulty breathing; call 911 and seek immediate emergency medical attention if this occurs.  You may review your lab tests and imaging results in their entirety on your MyChart account.  Please discuss all results of fully with your primary care provider and other specialist at your follow-up visit.  Note: Portions of  this text may have been transcribed using voice recognition software. Every effort was made to ensure accuracy; however, inadvertent computerized transcription errors may still be present.

## 2019-12-01 NOTE — ED Provider Notes (Addendum)
[facility]ERGENCY DEPARTMENT Provider Note   CSN: [id number] Arrival date & time: 12/01/19  1006     History Chief Complaint  Patient presents with  . Allergic Reaction    Lorraine Mcdaniel is a 54 y.o. female history of allergies, anxiety, hyperlipidemia, hysterectomy.  Patient presents today for concern of rash to her bilateral forearms and left cheek.  She reports rash began 1 week ago after visiting a friend's house, she is allergic to dogs and was around dogs but denies touching them.  She reports several hours later she developed a pruritic burning rash to her bilateral forearms and left cheek which has been constant moderate intensity no alleviating factors or clear aggravating factors.  She has been using Benadryl cream and cortisone cream without relief of her symptoms.  She reports this happened 3 years ago and she was seen by her primary care doctor and allergist, only improved after receiving a shot of a steroid by her PCP.  Denies fever/chills, headache, nausea/vomiting, diarrhea, abdominal pain, chest pain/shortness of breath, cough, throat swelling, voice change or any additional concerns.   HPI     Past Medical History:  Diagnosis Date  . Anxiety   . Hyperlipidemia   . Vitamin D deficiency     Patient Active Problem List   Diagnosis Date Noted  . Current smoker 08/30/2018  . Rash 12/27/2016  . Seasonal and perennial allergic rhinitis 12/27/2016  . Essential hypertension 09/30/2016  . Gastroesophageal reflux disease 02/16/2016  . Menopause 02/16/2016  . Hyperlipidemia 10/06/2014  . Vitamin D deficiency 08/20/2014  . GAD (generalized anxiety disorder) 07/10/2014    Past Surgical History:  Procedure Laterality Date  . ABDOMINAL HYSTERECTOMY  1991     OB History   No obstetric history on file.     Family History  Problem Relation Age of Onset  . Heart disease Mother   . Arthritis Mother   . Stroke Father   . Hypertension Father   . Heart disease  Father   . Cancer Sister        breast cancer in 2630's    Social History   Tobacco Use  . Smoking status: Former Smoker    Packs/day: 0.75    Types: Cigarettes    Start date: 11/23/2016  . Smokeless tobacco: Never Used  Vaping Use  . Vaping Use: Never used  Substance Use Topics  . Alcohol use: Yes    Comment: occasionally  . Drug use: No    Home Medications Prior to Admission medications   Medication Sig Start Date End Date Taking? Authorizing Provider  acetaminophen (TYLENOL) 500 MG tablet Take 1,000 mg by mouth every 6 (six) hours as needed for moderate pain.    [provider]  ALPRAZolam Prudy Feeler(XANAX) 0.25 MG tablet Take 1 tablet (0.25 mg total) by mouth 2 (two) times daily as needed for anxiety. 09/15/17   Elenora GammaBradshaw, Samuel L, MD  atorvastatin (LIPITOR) 10 MG tablet Take 1 tablet (10 mg total) by mouth daily. 08/30/18   Jannifer RodneyHawks, Christy A, FNP  Azelastine HCl 0.15 % SOLN Place 2 sprays into both nostrils 2 (two) times daily. 12/27/16   Alfonse SpruceGallagher, Joel Louis, MD  buPROPion (WELLBUTRIN XL) 150 MG 24 hr tablet TAKE 1 TABLET BY MOUTH ONCE DAILY IN THE MORNING FOR 30 DAYS 08/30/18   Jannifer RodneyHawks, Christy A, FNP  cetirizine (ZYRTEC) 10 MG tablet Take 1 tablet (10 mg total) by mouth 2 (two) times daily. 08/21/18   Junie SpencerHawks, Christy A, FNP  diphenhydrAMINE (BENADRYL) 25 MG tablet Take 1 tablet (25 mg total) by mouth every 6 (six) hours as needed for up to 5 days. 12/01/19 12/06/19  Harlene Salts A, PA-C  EPINEPHrine 0.3 mg/0.3 mL IJ SOAJ injection Inject 0.3 mg into the muscle once.    [provider]  EPINEPHrine 0.3 mg/0.3 mL IJ SOAJ injection Inject 0.3 mLs (0.3 mg total) into the muscle as needed for anaphylaxis. 12/01/19   Harlene Salts A, PA-C  famotidine (PEPCID) 20 MG tablet Take 1 tablet (20 mg total) by mouth 2 (two) times daily for 5 days. 12/01/19 12/06/19  Harlene Salts A, PA-C  losartan (COZAAR) 100 MG tablet Take 1 tablet (100 mg total) by mouth daily. 08/30/18   Jannifer Rodney A,  FNP  Olopatadine HCl (PAZEO) 0.7 % SOLN Place 1 drop into both eyes 2 (two) times daily as needed. 11/11/16   Alfonse Spruce, MD    Allergies    Prednisone, Chantix [varenicline tartrate], Elidel [pimecrolimus], Lisinopril, Valtrex [valacyclovir hcl], Percocet [oxycodone-acetaminophen], and Sulfa antibiotics  Review of Systems   Review of Systems  Constitutional: Negative.  Negative for chills and fever.  HENT: Positive for facial swelling (Left cheek). Negative for drooling, sore throat, trouble swallowing and voice change.   Respiratory: Negative.  Negative for cough and shortness of breath.   Cardiovascular: Negative.  Negative for chest pain.  Gastrointestinal: Negative.  Negative for abdominal pain, diarrhea, nausea and vomiting.  Skin: Positive for rash.    Physical Exam Updated Vital Signs BP 134/74 (BP Location: Left Arm)   Pulse 68   Temp 98 F (36.7 C) (Oral)   Resp 16   Ht 5\' 4"  (1.626 m)   Wt 86.2 kg   SpO2 99%   BMI 32.61 kg/m   Physical Exam Constitutional:      General: She is not in acute distress.    Appearance: Normal appearance. She is well-developed. She is not ill-appearing or diaphoretic.  HENT:     Head: Normocephalic and atraumatic.     Comments: No evidence of dental abscess. Eyes:     General: Vision grossly intact. Gaze aligned appropriately.     Pupils: Pupils are equal, round, and reactive to light.  Neck:     Trachea: Trachea and phonation normal.  Pulmonary:     Effort: Pulmonary effort is normal. No respiratory distress.  Abdominal:     General: There is no distension.     Palpations: Abdomen is soft.     Tenderness: There is no abdominal tenderness. There is no guarding or rebound.  Musculoskeletal:        General: Normal range of motion.     Cervical back: Normal range of motion.  Skin:    General: Skin is warm and dry.          Comments: Erythematous rash with appears to be small vesicles and excoriations present to the  bilateral forearms.  Erythema present to the left cheek.  See pictures below.  Neurological:     Mental Status: She is alert.     GCS: GCS eye subscore is 4. GCS verbal subscore is 5. GCS motor subscore is 6.     Comments: Speech is clear and goal oriented, follows commands Major Cranial nerves without deficit, no facial droop Moves extremities without ataxia, coordination intact  Psychiatric:        Behavior: Behavior normal.           ED Results / Procedures / Treatments  Labs (all labs ordered are listed, but only abnormal results are displayed) Labs Reviewed - No data to display  EKG None  Radiology No results found.  Procedures Procedures (including critical care time)  Medications Ordered in ED Medications  methylPREDNISolone sodium succinate (SOLU-MEDROL) 40 mg/mL injection 80 mg (80 mg Intravenous Given 12/01/19 1442)  diphenhydrAMINE (BENADRYL) injection 25 mg (25 mg Intravenous Given 12/01/19 1423)  famotidine (PEPCID) IVPB 20 mg premix (20 mg Intravenous New Bag/Given 12/01/19 1423)    ED Course  I have reviewed the triage vital signs and the nursing notes.  Pertinent labs & imaging results that were available during my care of the patient were reviewed by me and considered in my medical decision making (see chart for details).    MDM Rules/Calculators/A&P                          Additional history obtained from: 1. Nursing notes from this visit. 2. Electronic medical record reviewed.  Patient had visit with PCP in July 2018, she was treated with Depo-Medrol 80 mg injection as well as Benadryl.  It appears she was also started on Keflex and prescribed an EpiPen.  Review of allergist note on December 27, 2016, this was a follow-up visit for her rash.  Testing showed allergy to dogs cats and multiple other environmental substances.  They had patient start Zyrtec and azelastine.  They consider allergy shots.  Allergist felt rash had been secondary to contact  dermatitis and allergic trigger. ---------------------------------------- Patient with rash appears consistent with a contact dermatitis. Patient denies any difficulty breathing or swallowing.  Pt has a patent airway without stridor and is handling secretions without difficulty; no angioedema. No blisters, no pustules, no warmth, no draining sinus tracts, no superficial abscesses, no bullous impetigo, no vesicles, no desquamation, no target lesions with dusky purpura or a central bulla. Not tender to touch. No concern for superimposed infection, do not feel antibiotics are indicated at this time. No concern for SJS, TEN, TSS, tick borne illness, syphilis or other life-threatening condition.  Patient treated with Solu-Medrol 80 mg, Benadryl and Pepcid, discussed case and reviewed images with Dr. Estell Harpin who agreed with treatment plan. Of note patient denies history of diabetes or adverse reaction to solumedrol. On reassessment she is resting comfortably no acute distress reports pruritus is greatly improved she is requesting discharge.  Will have patient continue Benadryl/Pepcid at home and follow-up with her allergist and primary care doctor.  Additionally patient was given an EpiPen today and educated on its use and that she needs to return to the emergency department if she uses Epi.  At this time there does not appear to be any evidence of an acute emergency medical condition and the patient appears stable for discharge with appropriate outpatient follow up. Diagnosis was discussed with patient who verbalizes understanding of care plan and is agreeable to discharge. I have discussed return precautions with patient who verbalizes understanding. Patient encouraged to follow-up with their PCP. All questions answered.   Note: Portions of this report may have been transcribed using voice recognition software. Every effort was made to ensure accuracy; however, inadvertent computerized transcription errors may  still be present. Final Clinical Impression(s) / ED Diagnoses Final diagnoses:  Allergic reaction, initial encounter  Allergic contact dermatitis due to animal dander    Rx / DC Orders ED Discharge Orders         Ordered    diphenhydrAMINE (BENADRYL) 25  MG tablet  Every 6 hours PRN     Discontinue  Reprint     12/01/19 1604    EPINEPHrine 0.3 mg/0.3 mL IJ SOAJ injection  As needed     Discontinue  Reprint     12/01/19 1604    famotidine (PEPCID) 20 MG tablet  2 times daily     Discontinue  Reprint     12/01/19 1604           Bill Salinas, PA-C 12/01/19 1608    Bill Salinas, PA-C 12/01/19 1611    Bethann Berkshire, MD 12/03/19 1031

## 2019-12-01 NOTE — ED Notes (Addendum)
Rash to arms face and nose for a week   Has not contacted PCP   Here for eval   Pt has rash to arms that are blistered   Reports an allergy to dogs and went into home with dogs but did not touch them   Has seen allergist, dermatologist in past

## 2019-12-01 NOTE — ED Notes (Signed)
Pt denies being on any recent antibiotics

## 2019-12-01 NOTE — ED Notes (Signed)
upon taking pt IV out   Pt reports "I think here being here over six hours is enough"  Apologies for the length of stay but suggested that she is much improved   Pt to exit w spouse

## 2019-12-01 NOTE — ED Notes (Signed)
Drinks provided for both pt and spouse

## 2019-12-01 NOTE — ED Triage Notes (Addendum)
Pt reports allergic reaction that has gotten worse over the last week. Pt reports "blister like rash to bilateral arms that is now on face and affecting nose." airway patent. nad noted. Pt reports history of same in the past.

## 2019-12-02 ENCOUNTER — Encounter: Payer: Self-pay | Admitting: Family

## 2019-12-02 ENCOUNTER — Ambulatory Visit (INDEPENDENT_AMBULATORY_CARE_PROVIDER_SITE_OTHER): Payer: BC Managed Care – PPO | Admitting: Family

## 2019-12-02 DIAGNOSIS — T7840XA Allergy, unspecified, initial encounter: Secondary | ICD-10-CM | POA: Diagnosis not present

## 2019-12-02 DIAGNOSIS — Z09 Encounter for follow-up examination after completed treatment for conditions other than malignant neoplasm: Secondary | ICD-10-CM

## 2019-12-02 DIAGNOSIS — T7840XD Allergy, unspecified, subsequent encounter: Secondary | ICD-10-CM

## 2019-12-02 MED ORDER — METHYLPREDNISOLONE 4 MG PO TBPK: ORAL_TABLET | Freq: Every day | ORAL | 0 refills | Status: DC

## 2019-12-02 MED ORDER — HYDROXYZINE PAMOATE 25 MG PO CAPS: 25.0000 mg | ORAL_CAPSULE | Freq: Three times a day (TID) | ORAL | 0 refills | Status: DC | PRN

## 2019-12-02 NOTE — Progress Notes (Signed)
° °  Virtual Visit via telephone Note Due to COVID-19 pandemic this visit was conducted virtually. This visit type was conducted due to national recommendations for restrictions regarding the COVID-19 Pandemic (e.g. social distancing, sheltering in place) in an effort to limit this patient's exposure and mitigate transmission in our community. All issues noted in this document were discussed and addressed.  A physical exam was not performed with this format.  I connected with Lorraine Mcdaniel on 12/02/19 at 11:20 Am by telephone and verified that I am speaking with the correct person using two identifiers. Lorraine Mcdaniel is currently located at home and no one is currently with her during visit. The provider, Jannifer Rodney, FNP is located in their office at time of visit.  I discussed the limitations, risks, security and privacy concerns of performing an evaluation and management service by telephone and the availability of in person appointments. I also discussed with the patient that there may be a patient responsible charge related to this service. The patient expressed understanding and agreed to proceed.   History and Present Illness:  HPI  Pt calls the office today for hospital follow up. She went to the ED with rash on bilateral arms and face. She was diagnosed with an allergic reaction. She was given benadryl and Pepcid. She was given Soulu-Medrol. She reports the swelling in her face went down after her steroid injection, but this morning started swelling again.   She reports constant itching.   She has a follow up with the allergen on Wednesday.   Review of Systems  Skin: Positive for itching and rash.  All other systems reviewed and are negative.    Observations/Objective: No SOB or distress noted   Assessment and Plan: 1. Hospital discharge follow-up - hydrOXYzine (VISTARIL) 25 MG capsule; Take 1 capsule (25 mg total) by mouth 3 (three) times daily as needed.  Dispense: 30 capsule;  Refill: 0 - methylPREDNISolone (MEDROL DOSEPAK) 4 MG TBPK tablet; Take by mouth daily. Use as directed  Dispense: 21 each; Refill: 0  2. Allergic reaction, subsequent encounter - hydrOXYzine (VISTARIL) 25 MG capsule; Take 1 capsule (25 mg total) by mouth 3 (three) times daily as needed.  Dispense: 30 capsule; Refill: 0 - methylPREDNISolone (MEDROL DOSEPAK) 4 MG TBPK tablet; Take by mouth daily. Use as directed  Dispense: 21 each; Refill: 0  Continue Benadryl and Pepcid Will add Medrol dosepack Keep Allergen specialists  Avoid scratching  Report any increased redness, warmth, or fever   I discussed the assessment and treatment plan with the patient. The patient was provided an opportunity to ask questions and all were answered. The patient agreed with the plan and demonstrated an understanding of the instructions.   The patient was advised to call back or seek an in-person evaluation if the symptoms worsen or if the condition fails to improve as anticipated.  The above assessment and management plan was discussed with the patient. The patient verbalized understanding of and has agreed to the management plan. Patient is aware to call the clinic if symptoms persist or worsen. Patient is aware when to return to the clinic for a follow-up visit. Patient educated on when it is appropriate to go to the emergency department.   Time call ended:  11:34 AM  I provided 14 minutes of non-face-to-face time during this encounter.    Jannifer Rodney, FNP

## 2019-12-03 NOTE — Patient Instructions (Addendum)
Rash Start triamcinolone 0.1% cream compounded with Eucerin. Use this twice a day to red itchy areas. Apply this below the neck Continue Zyrtec 10 mg twice a day to help with itching Continue medrol dose pack as prescribed by your primary care physician Continue hydroxyzine 25 mg take one tablet at night as needed for itching Continue benadryl 25 mg every 6 hours as needed for itching Schedule for patch testing one month past stopping steroids  Dyspnea May use albuterol 2 puffs every 4 hours as needed for cough, wheeze, tightness in chest, or shortness of breath.  Seasonal and perennial allergic rhinitis (weeds, grass, mold, dust mite, cat, dog, cockroach) Start azelastine nasal spray 2 sprays each nostril twice a day as needed for runny nose.  Please let us know if this treatment plan is not working well for you. Schedule follow up appointment in one month after finishing steroids.

## 2019-12-04 ENCOUNTER — Ambulatory Visit: Payer: BC Managed Care – PPO | Admitting: Family

## 2019-12-04 ENCOUNTER — Encounter: Payer: Self-pay | Admitting: Allergy & Immunology

## 2019-12-04 ENCOUNTER — Other Ambulatory Visit: Payer: Self-pay

## 2019-12-04 ENCOUNTER — Telehealth: Payer: Self-pay | Admitting: Family

## 2019-12-04 VITALS — BP 136/82 | HR 99 | Temp 99.8°F | Resp 17 | Ht 64.0 in | Wt 190.5 lb

## 2019-12-04 DIAGNOSIS — R0600 Dyspnea, unspecified: Secondary | ICD-10-CM | POA: Diagnosis not present

## 2019-12-04 DIAGNOSIS — J302 Other seasonal allergic rhinitis: Secondary | ICD-10-CM

## 2019-12-04 DIAGNOSIS — J3089 Other allergic rhinitis: Secondary | ICD-10-CM

## 2019-12-04 DIAGNOSIS — R21 Rash and other nonspecific skin eruption: Secondary | ICD-10-CM | POA: Diagnosis not present

## 2019-12-04 MED ORDER — TRIAMCINOLONE ACETONIDE 0.1 % EX OINT: 1.0000 "application " | TOPICAL_OINTMENT | Freq: Two times a day (BID) | CUTANEOUS | 2 refills | Status: DC

## 2019-12-04 MED ORDER — AZELASTINE HCL 0.1 % NA SOLN: 2.0000 | Freq: Two times a day (BID) | NASAL | 5 refills | Status: DC | PRN

## 2019-12-04 MED ORDER — ALBUTEROL SULFATE HFA 108 (90 BASE) MCG/ACT IN AERS
2.0000 | INHALATION_SPRAY | RESPIRATORY_TRACT | 1 refills | Status: DC | PRN
Start: 2019-12-04 — End: 2021-09-16

## 2019-12-04 NOTE — Progress Notes (Signed)
[address], [address] [place] [id number] Dept: [phone]  FOLLOW UP NOTE  Patient ID: Lorraine Mcdaniel, female    DOB: 24-May-1965  Age: 54 y.o. MRN: [id number] Date of Office Visit: 12/04/2019  Assessment  Chief Complaint: Allergic Reaction and Rash  HPI Lorraine Mcdaniel is a 54 year old female who presents today for an acute visit.  She was last seen on December 27, 2016 by Dr. Ernst Bowler for rash and seasonal and perennial allergic rhinitis.  She reports that since her last appointment she has been doing well and without any rashes until July 26.  She reports the day before she was at a friend's house who has a dog, but she did not touch the dog.  The next day, on Monday she woke up with a little bit of rash on bilateral arms.  She tried using hydrocortisone cream, calamine lotion, Benadryl lotion, and Neosporin.  She reports that the calamine lotion aggravated the rash and that is why she used Neosporin.  A week later the rash started on her face and she had some facial swelling on the left side of her face.  She denied any difficulty breathing, swallowing, or gastrointestinal symptoms.  She reports in the past when she has had this rash it was just on her face and she had the swelling also.  She reports that the rash is itchy on her bilateral arms but feels like pins and burning on her face.  She feels like Vaseline has been most helpful for the rash on her face.  On August 8 she went to the emergency room and was given methylprednisolone 80 mg injection, diphenhydramine, Pepcid, and an EpiPen.  She saw her primary care physician yesterday and was given hydroxyzine 25 mg 3 times daily as needed and a Medrol Dosepak. She feels like the medrol dose pack might be making her nauseas,but it has helped with the swelling. She reports that the itching is better and that the swelling is down some.  She denies any new medications, new foods, and no new products.  No one else in the house has this rash  and she was not sick prior to this rash starting.  She has not been around any poison ivy and reports that she is not an outside person.  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  On July 2018 she had labs for her rash that were unrevealing: CRP, ESR, ANA, Tryptase, and alpha-gal.  Seasonal and perennial allergic rhinitis are reported as moderately controlled by medications.  She reports postnasal drip and ocular pruritus.  She denies any rhinorrhea nasal congestion.  She reports occasional shortness of breath and cough and denies any wheezing and tightness in her chest.  She currently smokes 1 pack of cigarettes a day and has smoked since the age of 37.    Current medications are as listed in the chart       Drug Allergies:  Allergies  Allergen Reactions  . Prednisone Rash  . Chantix [Varenicline Tartrate]     Violent attitude.  Terrance Mass [Pimecrolimus]     Cream may cause lip and facial swelling.  . Lisinopril     cough  . Valacyclovir Hcl Other (See Comments)    Lips and facial swelling Other reaction(s): Other (See Comments) Lips and facial swelling  . Other Palpitations  . Oxycodone-Acetaminophen     Other reaction(s): Other (See Comments) GI upset  . Percocet [Oxycodone-Acetaminophen] Other (See Comments)    GI  upset  . Sulfa Antibiotics Palpitations    Review of Systems: Review of Systems  Constitutional: Negative for chills and fever.  HENT: Negative for congestion.        Denies rhinorrhea  Eyes:       Reports itchy watery eyes  Respiratory: Positive for cough and shortness of breath. Negative for wheezing.   Cardiovascular: Positive for palpitations. Negative for chest pain.       Reports at least a 13 year history of palpitations and reports she wore a heart monitor and the results were normal.  Gastrointestinal: Negative for abdominal pain and heartburn.  Genitourinary: Negative for dysuria.  Skin: Positive for itching and rash.  Neurological:  Negative for headaches.  Endo/Heme/Allergies: Positive for environmental allergies.    Physical Exam: BP 136/82   Pulse 99   Temp 99.8 F (37.7 C) (Temporal)   Resp 17   Ht 5' 4" (1.626 m)   Wt 190 lb 8 oz (86.4 kg)   SpO2 96%   BMI 32.70 kg/m    Physical Exam Constitutional:      Appearance: Normal appearance.  HENT:     Head: Normocephalic and atraumatic.     Right Ear: Tympanic membrane, ear canal and external ear normal.     Left Ear: Tympanic membrane, ear canal and external ear normal.     Nose: Nose normal.     Mouth/Throat:     Mouth: Mucous membranes are moist.     Pharynx: Oropharynx is clear.  Eyes:     Conjunctiva/sclera: Conjunctivae normal.  Cardiovascular:     Rate and Rhythm: Regular rhythm.     Heart sounds: Normal heart sounds.  Pulmonary:     Effort: Pulmonary effort is normal.     Breath sounds: Normal breath sounds.     Comments: Lungs clear to auscultation Musculoskeletal:     Cervical back: Neck supple.  Skin:    General: Skin is warm.     Comments: Vesicular erythematous rash with excoriation marks noted on bilateral arms. Vesicular erythematous rash noted on chin.  Neurological:     Mental Status: She is alert and oriented to person, place, and time.  Psychiatric:        Mood and Affect: Mood normal.        Behavior: Behavior normal.        Thought Content: Thought content normal.        Judgment: Judgment normal.     Diagnostics:  FVC 3.32 L, FEV1 2.60 L.  Predicted FVC 3.45 L, FEV1 2.70 L.  Spirometry indicates normal ventilatory function.  Assessment and Plan: 1. Rash   2. Dyspnea, unspecified type   3. Seasonal and perennial allergic rhinitis     Meds ordered this encounter  Medications  . triamcinolone ointment (KENALOG) 0.1 %    Sig: Apply 1 application topically 2 (two) times daily.    Dispense:  454 g    Refill:  2    Mix 1:1 with Eucerin    Patient Instructions  Rash Start triamcinolone 0.1% cream compounded with  Eucerin. Use this twice a day to red itchy areas. Apply this below the neck Continue Zyrtec 10 mg twice a day to help with itching Continue medrol dose pack as prescribed by your primary care physician Continue hydroxyzine 25 mg take one tablet at night as needed for itching Continue benadryl 25 mg every 6 hours as needed for itching Schedule for patch testing one month past stopping steroids  Dyspnea   May use albuterol 2 puffs every 4 hours as needed for cough, wheeze, tightness in chest, or shortness of breath.  Seasonal and perennial allergic rhinitis (weeds, grass, mold, dust mite, cat, dog, cockroach) Start azelastine nasal spray 2 sprays each nostril twice a day as needed for runny nose.  Please let us know if this treatment plan is not working well for you. Schedule follow up appointment in one month after finishing steroids.   Return in about 4 weeks (around 01/01/2020), or if symptoms worsen or fail to improve, for patch testing.    Thank you for the opportunity to care for this patient.  Please do not hesitate to contact me with questions.  Christine Dale, FNP [facility]     

## 2019-12-04 NOTE — Telephone Encounter (Signed)
FYI

## 2019-12-04 NOTE — Telephone Encounter (Signed)
Pt states she had to stop the steroid. States she felt like she did last time before she had to go to the hospital. Also pt states she went to the allergist today.

## 2019-12-13 NOTE — Addendum Note (Signed)
Addended by: Deborra Medina on: 12/13/2019 04:31 PM   Modules accepted: Orders

## 2020-01-13 ENCOUNTER — Ambulatory Visit: Payer: BC Managed Care – PPO | Admitting: Family

## 2020-01-13 ENCOUNTER — Other Ambulatory Visit: Payer: Self-pay | Admitting: *Deleted

## 2020-01-13 DIAGNOSIS — I10 Essential (primary) hypertension: Secondary | ICD-10-CM

## 2020-01-13 NOTE — Telephone Encounter (Signed)
[facility] NTBS LOV 08/30/18

## 2020-01-15 ENCOUNTER — Telehealth: Payer: Self-pay | Admitting: Family

## 2020-01-15 ENCOUNTER — Encounter: Payer: BC Managed Care – PPO | Admitting: Family

## 2020-01-17 ENCOUNTER — Encounter: Payer: BC Managed Care – PPO | Admitting: Allergy & Immunology

## 2020-01-27 ENCOUNTER — Other Ambulatory Visit: Payer: Self-pay

## 2020-01-27 ENCOUNTER — Encounter: Payer: Self-pay | Admitting: Family

## 2020-01-27 ENCOUNTER — Ambulatory Visit (INDEPENDENT_AMBULATORY_CARE_PROVIDER_SITE_OTHER): Payer: BC Managed Care – PPO | Admitting: Family

## 2020-01-27 VITALS — BP 120/80 | HR 86 | Temp 97.8°F | Resp 18

## 2020-01-27 DIAGNOSIS — L259 Unspecified contact dermatitis, unspecified cause: Secondary | ICD-10-CM | POA: Diagnosis not present

## 2020-01-27 NOTE — Progress Notes (Signed)
Follow-up Note  RE: Lorraine Mcdaniel MRN: [id number] DOB: 1965-06-23 Date of Office Visit: 01/27/2020  Primary care provider: Junie Spencer, FNP Referring provider: Junie Spencer, FNP   Lorraine Mcdaniel returns to the office today for the patch test placement, given suspected history of contact dermatitis.    Diagnostics: True Test patches placed.    Plan:   Allergic contact dermatitis - Instructions provided on care of the patches for the next 48 hours. Lorraine Mcdaniel was instructed to avoid showering for the next 48 hours. Lorraine Mcdaniel will follow up in 48 hours and 96 hours for patch readings.  Thank you for the opportunity to care for Lorraine Mcdaniel. Please do not hesitate to contact us with any questions.  Lorraine Settle, FNP [facility]

## 2020-01-27 NOTE — Addendum Note (Signed)
Addended by: Juliane Poot B on: 01/27/2020 09:39 AM   Modules accepted: Orders

## 2020-01-29 ENCOUNTER — Ambulatory Visit: Payer: BC Managed Care – PPO | Admitting: Family

## 2020-01-29 ENCOUNTER — Encounter: Payer: Self-pay | Admitting: Family

## 2020-01-29 ENCOUNTER — Other Ambulatory Visit: Payer: Self-pay

## 2020-01-29 DIAGNOSIS — L259 Unspecified contact dermatitis, unspecified cause: Secondary | ICD-10-CM

## 2020-01-29 NOTE — Progress Notes (Signed)
Cheyann returns to the office today for the final patch test interpretation, given suspected history of contact dermatitis.    Diagnostics:   TRUE TEST 48-hour hour reading: negative reaction to #1 (Nickel Sulfate), negative reaction to #2 (Wool Alcohols), negative reaction to #3 (Neomycin sulfate), negative reaction to #4 (Potassium dichromate), negative reaction to #5 [employer] mix), negative reaction to  #6 (Fragrance mix), negative reaction to #7 (Colophony), negative reaction to #9 (Paraben mix), negative reaction to #10 (Balsam of [place]), negative reaction to #11 (Ethylenediamine dihydrochloride), negative reaction to #12 (Cobalt chloride), negative reaction to #13 (p-tert Butylphenol formaldehyde resin), negative reaction to #14 (Epoxy resin), negative reaction to #15 (Carba mix), negative reaction to #16 (Black rubber mix), negative reaction to #17 (Cl+Me-Isothiazolinone), negative reaction to #18 (Quaternium-15), negative reaction to #19 (Methyldibromo Glutaronitrile), negative reaction to #20 (p-Phenylene-diamine), negative reaction to #21 (Formaldehyde), negative reaction to #22 (Mercapto mix), positive reaction to #23 (Thiomersal), negative reaction to #24 (Thiuram mix), negative reaction to #25 (Diazolidinyl urea), negative reaction to #26 (Quinoline mix), positive reaction to #27 (Tixocortol-21-pivalate), negative reaction to #28 (Gold sodium thiosulfate), negative reaction to #29 (Imidazolidinyl urea), negative reaction to #30 (Budesonide), negative reaction to #31 (Hydrocortisone-17-butyrate), negative reaction to #32 (Mercapto-benzothiazole), negative reaction to #33 (Bacitracin), negative reaction to #34 (Parthenolide), negative reaction to #35 (Disperse blue) and negative reaction to #36 (Bronopol)  Plan:   Allergic contact dermatitis - The patient has been provided detailed information regarding the substances she is sensitive to, as well as products containing the substances.   - Meticulous  avoidance of these substances is recommended.  - If avoidance is not possible, the use of barrier creams or lotions is recommended. - If symptoms persist or progress despite meticulous avoidance of Thimerosal and Tixocortol-21-Pivalate , Dermatology Referral may be warranted.  Thank you for the opportunity to care for [facility]. Please call us if you have any questions.  Nehemiah Settle, FNP [facility]

## 2020-01-31 ENCOUNTER — Other Ambulatory Visit: Payer: Self-pay

## 2020-01-31 ENCOUNTER — Ambulatory Visit (INDEPENDENT_AMBULATORY_CARE_PROVIDER_SITE_OTHER): Payer: BC Managed Care – PPO | Admitting: Allergy & Immunology

## 2020-01-31 ENCOUNTER — Ambulatory Visit: Payer: BC Managed Care – PPO | Admitting: Family

## 2020-01-31 DIAGNOSIS — Z7185 Encounter for immunization safety counseling: Secondary | ICD-10-CM | POA: Diagnosis not present

## 2020-01-31 DIAGNOSIS — L233 Allergic contact dermatitis due to drugs in contact with skin: Secondary | ICD-10-CM

## 2020-01-31 NOTE — Patient Instructions (Addendum)
1. Contact dermatitis -  Testing showed: 1+ reaction to #3 (Neomycin sulfate), 4+ reaction to #23 (Thiomersal) and 4+ reaction to #27 (Tixocortol-21-pivalate) - Avoidance measures for neomycin provided. - We will send an email on safe products (I need to figure out how to log into that system!). - Take a picture of your back on Monday and send to [place] via MyChart. - I am in particular concerned with the remaining steroids, which I have outlined on your back.   2. Return in about 4 weeks (around 02/28/2020).   Please inform us of any Emergency Department visits, hospitalizations, or changes in symptoms. Call us before going to the ED for breathing or allergy symptoms since we might be able to fit you in for a sick visit. Feel free to contact us anytime with any questions, problems, or concerns.  It was a pleasure to see you again today!  Websites that have reliable patient information: 1. American Academy of Asthma, Allergy, and Immunology: www.aaaai.org 2. Food Allergy Research and Education (FARE): foodallergy.org 3. Mothers of Asthmatics: http://www.asthmacommunitynetwork.org 4. American College of Allergy, Asthma, and Immunology: www.acaai.org   COVID-19 Vaccine Information can be found at: PodExchange.nl For questions related to vaccine distribution or appointments, please email [email or web address] or call [phone].     "Like" [place] on Facebook and Instagram for our latest updates!     HAPPY FALL!     Make sure you are registered to vote! If you have moved or changed any of your contact information, you will need to get this updated before voting!  In some cases, you MAY be able to register to vote online: [email or web address]    Toney Rakes

## 2020-01-31 NOTE — Progress Notes (Signed)
    Follow-up Note  RE: Lorraine Mcdaniel MRN: [id number] DOB: 10-26-1965 Date of Office Visit: 01/31/2020  Primary care provider: Junie Spencer, FNP Referring provider: Junie Spencer, FNP   [name] returns to the office today for the final patch test interpretation, given suspected history of contact dermatitis.  We did spend a lot of time talking about the avoidance measures.  We printed off avoidance to new sensitization that popped up, neomycin.  She has a number of questions today.  In fact, she even calls back later to discuss more than.  Many of her questions seem to center on the COVID-19 vaccine.  She has never had a bad reaction with the vaccine in the past.  However, she has read a lot which she is very worried about.  These seem to be mostly focused on anecdotal stories and not on actual research, but she is asking some good questions and we talked through a lot of the issues.  She is very willing to get the vaccine and would like to get it under supervision.  I had her schedule an appointment for 9 AM on October 22, which is her next COVID-19 vaccine clinic.  I want to get the centers of information on the COVID-19 vaccines.   Diagnostics:   TRUE TEST 96-hour reading: 1+ reaction to #3 (Neomycin sulfate), 4+ reaction to #23 (Thiomersal) and 4+ reaction to #27 (Tixocortol-21-pivalate)     Plan:   Allergic contact dermatitis - The patient has been provided detailed information regarding the substances she is sensitive to, as well as products containing the substances.   - Meticulous avoidance of these substances is recommended.  - If avoidance is not possible, the use of barrier creams or lotions is recommended. - If symptoms persist or progress despite meticulous avoidance of the above chemicals, Dermatology Referral may be warranted. - Because of her sensitization to 1 steroid, I did ask her to take a picture of her back on Monday.  Specifically, I outlined to the other steroids  on the True Test patch template so that she would know which ones to focus on. - Sometimes the steroids can take a week to react on patch testing, so I do not want to miss that. - We are also going to send her a list of "safe products", but I need to find our login for that site.   Vaccine counseling - I am going to send her information on vaccination safety. - While she does not need component testing to the COVID19 vaccine, I did offer to test her to the vaccination itself via SPT to make sure this is negative. - This did make her feel more comfortable, so we will plan to do that with expired vaccine that we have. - We will watch her for 60 minutes after the injection itself to make sure that she does well.    Total of 30 minutes, greater than 50% of which was spent in discussion of treatment and management options.      Malachi Bonds, MD  [facility]

## 2020-02-02 ENCOUNTER — Encounter: Payer: Self-pay | Admitting: Allergy & Immunology

## 2020-02-03 ENCOUNTER — Encounter: Payer: Self-pay | Admitting: Allergy & Immunology

## 2020-02-14 ENCOUNTER — Ambulatory Visit: Payer: BC Managed Care – PPO

## 2020-02-14 ENCOUNTER — Other Ambulatory Visit: Payer: Self-pay

## 2020-02-21 ENCOUNTER — Ambulatory Visit: Payer: BC Managed Care – PPO | Admitting: Allergy & Immunology

## 2020-03-11 ENCOUNTER — Ambulatory Visit: Payer: BC Managed Care – PPO | Admitting: Family

## 2020-03-13 ENCOUNTER — Ambulatory Visit: Payer: BC Managed Care – PPO

## 2020-03-13 ENCOUNTER — Ambulatory Visit: Payer: BC Managed Care – PPO | Admitting: Allergy & Immunology

## 2020-04-27 ENCOUNTER — Encounter: Payer: Self-pay | Admitting: Family Medicine

## 2020-04-27 ENCOUNTER — Telehealth (INDEPENDENT_AMBULATORY_CARE_PROVIDER_SITE_OTHER): Payer: BC Managed Care – PPO | Admitting: Family Medicine

## 2020-04-27 DIAGNOSIS — J0110 Acute frontal sinusitis, unspecified: Secondary | ICD-10-CM

## 2020-04-27 MED ORDER — AMOXICILLIN 875 MG PO TABS: 875.0000 mg | ORAL_TABLET | Freq: Two times a day (BID) | ORAL | 0 refills | Status: DC

## 2020-04-27 NOTE — Progress Notes (Signed)
   Virtual Visit via telephone Note Due to COVID-19 pandemic this visit was conducted virtually. This visit type was conducted due to national recommendations for restrictions regarding the COVID-19 Pandemic (e.g. social distancing, sheltering in place) in an effort to limit this patient's exposure and mitigate transmission in our community. All issues noted in this document were discussed and addressed.  A physical exam was not performed with this format.  I connected with Lorraine Mcdaniel on 04/27/20 at 1201 by telephone and verified that I am speaking with the correct person using two identifiers. Lorraine Mcdaniel is currently located at home and her husband is currently with her during visit. The provider, Gabriel Earing, FNP is located in their office at time of visit.  I discussed the limitations, risks, security and privacy concerns of performing an evaluation and management service by telephone and the availability of in person appointments. I also discussed with the patient that there may be a patient responsible charge related to this service. The patient expressed understanding and agreed to proceed.  CC: facial pain, congestion  History and Present Illness:  HPI  [name] reports sinus congestion and facial pain around her cheeks and forehead for 5 days. She has been Tylenol, OTC supplements, and mucinex without improvement. She denies fever, body aches, chills, sore throat, or shortness of breath. She does report a non productive cough, ear congestion, and scratchy throat as well.  Denies exposure to Covid.    ROS As per HPI.   Observations/Objective: Alert and oriented x3. Able to speak in full sentences without difficulty.   Assessment and Plan: Diagnoses and all orders for this visit:  Acute non-recurrent frontal sinusitis Declined covid testing today. Discussed symptomatic care. Work note uploaded to [employer], patient will come by office tomorrow for a printout. Return to office for  new or worsening symptoms, or if symptoms persist.  -     amoxicillin (AMOXIL) 875 MG tablet; Take 1 tablet (875 mg total) by mouth 2 (two) times daily.  Follow Up Instructions: Follow up as needed.    I discussed the assessment and treatment plan with the patient. The patient was provided an opportunity to ask questions and all were answered. The patient agreed with the plan and demonstrated an understanding of the instructions.   The patient was advised to call back or seek an in-person evaluation if the symptoms worsen or if the condition fails to improve as anticipated.  The above assessment and management plan was discussed with the patient. The patient verbalized understanding of and has agreed to the management plan. Patient is aware to call the clinic if symptoms persist or worsen. Patient is aware when to return to the clinic for a follow-up visit. Patient educated on when it is appropriate to go to the emergency department.   Time call ended:  1212  I provided 15 minutes of non-face-to-face time during this encounter.    Gabriel Earing, FNP

## 2020-11-06 DIAGNOSIS — H40053 Ocular hypertension, bilateral: Secondary | ICD-10-CM | POA: Diagnosis not present

## 2020-12-31 ENCOUNTER — Other Ambulatory Visit: Payer: Self-pay | Admitting: Family

## 2020-12-31 DIAGNOSIS — F411 Generalized anxiety disorder: Secondary | ICD-10-CM

## 2020-12-31 DIAGNOSIS — F17200 Nicotine dependence, unspecified, uncomplicated: Secondary | ICD-10-CM

## 2021-01-14 DIAGNOSIS — H40053 Ocular hypertension, bilateral: Secondary | ICD-10-CM | POA: Diagnosis not present

## 2021-05-10 DIAGNOSIS — L821 Other seborrheic keratosis: Secondary | ICD-10-CM | POA: Diagnosis not present

## 2021-05-10 DIAGNOSIS — L820 Inflamed seborrheic keratosis: Secondary | ICD-10-CM | POA: Diagnosis not present

## 2021-07-23 DIAGNOSIS — H40053 Ocular hypertension, bilateral: Secondary | ICD-10-CM | POA: Diagnosis not present

## 2021-09-16 ENCOUNTER — Encounter: Payer: Self-pay | Admitting: Family

## 2021-09-16 ENCOUNTER — Ambulatory Visit: Payer: BC Managed Care – PPO | Admitting: Family

## 2021-09-16 VITALS — BP 123/65 | HR 83 | Temp 96.7°F | Wt 196.2 lb

## 2021-09-16 DIAGNOSIS — F411 Generalized anxiety disorder: Secondary | ICD-10-CM | POA: Diagnosis not present

## 2021-09-16 DIAGNOSIS — Z79899 Other long term (current) drug therapy: Secondary | ICD-10-CM | POA: Insufficient documentation

## 2021-09-16 DIAGNOSIS — I10 Essential (primary) hypertension: Secondary | ICD-10-CM

## 2021-09-16 DIAGNOSIS — Z79891 Long term (current) use of opiate analgesic: Secondary | ICD-10-CM | POA: Diagnosis not present

## 2021-09-16 DIAGNOSIS — F17200 Nicotine dependence, unspecified, uncomplicated: Secondary | ICD-10-CM | POA: Diagnosis not present

## 2021-09-16 DIAGNOSIS — S60465A Insect bite (nonvenomous) of left ring finger, initial encounter: Secondary | ICD-10-CM

## 2021-09-16 DIAGNOSIS — W57XXXA Bitten or stung by nonvenomous insect and other nonvenomous arthropods, initial encounter: Secondary | ICD-10-CM

## 2021-09-16 MED ORDER — ALPRAZOLAM 0.25 MG PO TABS: 0.2500 mg | ORAL_TABLET | Freq: Two times a day (BID) | ORAL | 0 refills | Status: DC | PRN

## 2021-09-16 MED ORDER — DOXYCYCLINE HYCLATE 100 MG PO TABS: 100.0000 mg | ORAL_TABLET | Freq: Two times a day (BID) | ORAL | 0 refills | Status: DC

## 2021-09-16 MED ORDER — ONDANSETRON HCL 4 MG PO TABS: 4.0000 mg | ORAL_TABLET | Freq: Three times a day (TID) | ORAL | 0 refills | Status: DC | PRN

## 2021-09-16 NOTE — Patient Instructions (Signed)
Tick Bite Information, Adult Ticks are insects that draw blood for food. Most ticks live in shrubs and grassy and wooded areas. They climb onto people and animals that brush against the leaves and grasses that they rest on. Then they bite, attaching themselves to the skin. Most ticks are harmless, but some ticks may carry germs that can spread to a person through a bite and cause a disease. To reduce your risk of getting a disease from a tick bite, make sure you: Take steps to prevent tick bites. Check for ticks after being outdoors where ticks live. Watch for symptoms of disease if a tick attached to you or if you suspect a tick bite. How can I prevent tick bites? Take these steps to help prevent tick bites when you go outdoors in an area where ticks live: Use insect repellent Use insect repellent that has DEET (20% or higher), picaridin, or IR3535 in it. Follow the instructions on the label. Use these products on: Bare skin. The top of your boots. Your pant legs. Your sleeve cuffs. For insect repellent that contains permethrin, follow the instructions on the label. Use these products on: Clothing. Boots. Outdoor gear. Tents. When you are outside Wear protective clothing. Long sleeves and long pants offer the best protection from ticks. Wear light-colored clothing so you can see ticks more easily. Tuck your pant legs into your socks. If you go walking on a trail, stay in the middle of the trail so your skin, hair, and clothing do not touch the bushes. Avoid walking through areas with long grass. Check for ticks on your clothing, hair, and skin often while you are outside, and check again before you go inside. Make sure to check the scalp, neck, armpits, waist, groin, and joint areas. These are the spots where ticks attach themselves most often. When you go indoors Check your clothing for ticks. Tumble dry clothes in a dryer on high heat for at least 10 minutes. If clothes are damp,  additional time may be needed. If clothes require washing, use hot water. Examine gear and pets. Shower soon after being outdoors. Check your body for ticks. Conduct a full body check using a mirror. What is the proper way to remove a tick? If you find a tick on your body, remove it as soon as possible. Removing a tick sooner can prevent germs from passing to your body. Do not remove the tick with your bare fingers. To remove a tick that is crawling on your skin but has not bitten, use either of these methods: Go outdoors and brush the tick off. Remove the tick with tape or a lint roller. To remove a tick that is attached to your skin: Wash your hands. If you have latex gloves, put them on. Use fine-tipped tweezers, curved forceps, or a tick-removal tool to gently grasp the tick as close to your skin and the tick's head as possible. Gently pull with a steady, upward, even pressure until the tick lets go. When removing the tick: Take care to keep the tick's head attached to its body. Do not twist or jerk the tick. This can make the tick's head or mouth parts break off and remain in the skin. Do not squeeze or crush the tick's body. This could force disease-carrying fluids from the tick into your body. Do not try to remove a tick with heat, alcohol, petroleum jelly, or fingernail polish. Using these methods can cause the tick to salivate and regurgitate into your bloodstream,   increasing your risk of getting a disease. What should I do after removing a tick? Dispose of the tick. Do not crush a tick with your fingers. Clean the bite area and your hands with soap and water, rubbing alcohol, or an iodine scrub. If an antiseptic cream or ointment is available, apply a small amount to the bite site. Wash and disinfect any instruments that you used to remove the tick. How should I dispose of a tick? To dispose of a live tick, use one of these methods: Place it in rubbing alcohol. Place it in a sealed  bag or container. Wrap it tightly in tape. Flush it down the toilet. Contact a health care provider if: You have symptoms of a disease after a tick bite. Symptoms of a tick-borne disease can occur from moments after the tick bites to 30 days after a tick is removed. Symptoms include: Fever or chills. Any of these signs in the bite area: A red rash that makes a circle (bull's-eye rash) in the bite area. Redness and swelling. Headache. Muscle, joint, or bone pain. Abnormal tiredness. Numbness in your legs or difficulty walking or moving your legs. Tender, swollen lymph glands. A part of a tick breaks off and gets stuck in your skin. Get help right away if: You are not able to remove a tick. You experience muscle weakness or paralysis. Your symptoms get worse or you experience new symptoms. You find an engorged tick on your skin and you are in an area where disease from ticks is a high risk. Summary Ticks may carry germs that can spread to a person through a bite and cause a disease. Wear protective clothing and use insect repellent to prevent tick bites. Follow the instructions on the label. If you find a tick on your body, remove it as soon as possible. If the tick is attached, do not try to remove with heat, alcohol, petroleum jelly, or fingernail polish. Remove the attached tick using fine-tipped tweezers, curved forceps, or a tick-removal tool. Gently pull with steady, upward, even pressure until the tick lets go. Do not twist or jerk the tick. Do not squeeze or crush the tick's body. If you have symptoms of a disease after being bitten by a tick, contact a health care provider. This information is not intended to replace advice given to you by your health care provider. Make sure you discuss any questions you have with your health care provider. Document Revised: 04/08/2019 Document Reviewed: 04/08/2019 Elsevier Patient Education  2023 Elsevier Inc.  

## 2021-09-16 NOTE — Progress Notes (Signed)
Subjective:    Patient ID: Lorraine Mcdaniel, female    DOB: 06/24/1965, 56 y.o.   MRN: [id number]  \ Chief Complaint  Patient presents with   Tick Removal    Tick bite on Sunday   Pt presents to the office today with a tick bite on left ring finger. She is unsure how long it was attached, but removed it on Sunday. Denies fever, new headaches, rash, or joint pain. States the area blistered up.   She had lab work drawn on 09/13/21. Anxiety Presents for follow-up visit. Symptoms include excessive worry, irritability, nervous/anxious behavior and restlessness. Patient reports no shortness of breath. Symptoms occur occasionally. The severity of symptoms is moderate.    Nicotine Dependence Presents for follow-up visit. Symptoms include irritability. Her urge triggers include company of smokers. The symptoms have been stable. She smokes 1 pack of cigarettes per day.  Hypertension This is a chronic problem. The current episode started more than 1 year ago. The problem has been resolved since onset. The problem is controlled. Associated symptoms include anxiety. Pertinent negatives include no malaise/fatigue, peripheral edema or shortness of breath. The current treatment provides moderate improvement.     Review of Systems  Constitutional:  Positive for irritability. Negative for malaise/fatigue.  Respiratory:  Negative for shortness of breath.   Psychiatric/Behavioral:  The patient is nervous/anxious.   All other systems reviewed and are negative.     Objective:   Physical Exam Vitals reviewed.  Constitutional:      General: She is not in acute distress.    Appearance: She is well-developed.  HENT:     Head: Normocephalic and atraumatic.  Eyes:     Pupils: Pupils are equal, round, and reactive to light.  Neck:     Thyroid: No thyromegaly.  Cardiovascular:     Rate and Rhythm: Normal rate and regular rhythm.     Heart sounds: Normal heart sounds. No murmur heard. Pulmonary:      Effort: Pulmonary effort is normal. No respiratory distress.     Breath sounds: Normal breath sounds. No wheezing.  Abdominal:     General: Bowel sounds are normal. There is no distension.     Palpations: Abdomen is soft.     Tenderness: There is no abdominal tenderness.  Musculoskeletal:        General: No tenderness. Normal range of motion.     Cervical back: Normal range of motion and neck supple.  Skin:    General: Skin is warm and dry.  Neurological:     Mental Status: She is alert and oriented to person, place, and time.     Cranial Nerves: No cranial nerve deficit.     Deep Tendon Reflexes: Reflexes are normal and symmetric.  Psychiatric:        Behavior: Behavior normal.        Thought Content: Thought content normal.        Judgment: Judgment normal.       Blood pressure 123/65, pulse 83, temperature (!) 96.7 F (35.9 C), weight 196 lb 3.2 oz (89 kg), SpO2 98 %.    Assessment & Plan:  Lorraine Mcdaniel comes in today with chief complaint of Tick Removal (Tick bite on Sunday)   Diagnosis and orders addressed:  1. Tick bite of left ring finger, initial encounter -Pt to report any new fever, joint pain, or rash -Wear protective clothing while outside- Long sleeves and long pants -Put insect repellent on all exposed skin and along clothing -  Take a shower as soon as possible after being outside Follow up if symptoms worsen or do not improve  - doxycycline (VIBRA-TABS) 100 MG tablet; Take 1 tablet (100 mg total) by mouth 2 (two) times daily.  Dispense: 20 tablet; Refill: 0  2. GAD (generalized anxiety disorder) Xanax as needed Only gets #20 a year  Patient reviewed in [place] controlled database, no flags noted. Contract and drug screen are up to date.  - ALPRAZolam (XANAX) 0.25 MG tablet; Take 1 tablet (0.25 mg total) by mouth 2 (two) times daily as needed for anxiety.  Dispense: 20 tablet; Refill: 0  3. Controlled substance agreement signed  4. Current smoker Smoking  cessation discussed  5. Essential hypertension    Health Maintenance reviewed Diet and exercise encouraged  Follow up plan: 6 months    Jannifer Rodney, FNP

## 2021-09-22 LAB — TOXASSURE SELECT 13 (MW), URINE

## 2022-02-23 ENCOUNTER — Other Ambulatory Visit: Payer: Self-pay | Admitting: Family

## 2022-02-23 DIAGNOSIS — Z1231 Encounter for screening mammogram for malignant neoplasm of breast: Secondary | ICD-10-CM

## 2022-02-28 ENCOUNTER — Ambulatory Visit
Admission: RE | Admit: 2022-02-28 | Discharge: 2022-02-28 | Disposition: A | Discharge status: Home / Self Care | Payer: BC Managed Care – PPO | Source: Ambulatory Visit | Attending: Family | Admitting: Family

## 2022-02-28 DIAGNOSIS — Z1231 Encounter for screening mammogram for malignant neoplasm of breast: Secondary | ICD-10-CM | POA: Diagnosis not present

## 2022-03-15 DIAGNOSIS — H40013 Open angle with borderline findings, low risk, bilateral: Secondary | ICD-10-CM | POA: Diagnosis not present

## 2022-09-07 LAB — LAB REPORT - SCANNED
A1c: 6.3
EGFR: 77

## 2022-10-07 ENCOUNTER — Ambulatory Visit: Payer: BC Managed Care – PPO | Admitting: Family

## 2022-10-14 ENCOUNTER — Encounter: Payer: Self-pay | Admitting: Family

## 2022-10-14 ENCOUNTER — Ambulatory Visit: Payer: BC Managed Care – PPO | Admitting: Family

## 2022-10-14 VITALS — BP 119/78 | HR 78 | Temp 97.8°F | Ht 64.0 in | Wt 198.0 lb

## 2022-10-14 DIAGNOSIS — E559 Vitamin D deficiency, unspecified: Secondary | ICD-10-CM

## 2022-10-14 DIAGNOSIS — K219 Gastro-esophageal reflux disease without esophagitis: Secondary | ICD-10-CM

## 2022-10-14 DIAGNOSIS — E6609 Other obesity due to excess calories: Secondary | ICD-10-CM

## 2022-10-14 DIAGNOSIS — F411 Generalized anxiety disorder: Secondary | ICD-10-CM | POA: Diagnosis not present

## 2022-10-14 DIAGNOSIS — I10 Essential (primary) hypertension: Secondary | ICD-10-CM | POA: Diagnosis not present

## 2022-10-14 DIAGNOSIS — Z79891 Long term (current) use of opiate analgesic: Secondary | ICD-10-CM | POA: Diagnosis not present

## 2022-10-14 DIAGNOSIS — R7303 Prediabetes: Secondary | ICD-10-CM

## 2022-10-14 DIAGNOSIS — E669 Obesity, unspecified: Secondary | ICD-10-CM | POA: Insufficient documentation

## 2022-10-14 DIAGNOSIS — Z79899 Other long term (current) drug therapy: Secondary | ICD-10-CM

## 2022-10-14 DIAGNOSIS — Z0001 Encounter for general adult medical examination with abnormal findings: Secondary | ICD-10-CM

## 2022-10-14 DIAGNOSIS — Z6833 Body mass index (BMI) 33.0-33.9, adult: Secondary | ICD-10-CM

## 2022-10-14 DIAGNOSIS — F17200 Nicotine dependence, unspecified, uncomplicated: Secondary | ICD-10-CM

## 2022-10-14 DIAGNOSIS — E785 Hyperlipidemia, unspecified: Secondary | ICD-10-CM

## 2022-10-14 DIAGNOSIS — Z713 Dietary counseling and surveillance: Secondary | ICD-10-CM

## 2022-10-14 DIAGNOSIS — Z1211 Encounter for screening for malignant neoplasm of colon: Secondary | ICD-10-CM

## 2022-10-14 MED ORDER — ALPRAZOLAM 0.25 MG PO TABS: 0.2500 mg | ORAL_TABLET | Freq: Two times a day (BID) | ORAL | 2 refills | Status: DC | PRN

## 2022-10-14 MED ORDER — SEMAGLUTIDE(0.25 OR 0.5MG/DOS) 2 MG/3ML ~~LOC~~ SOPN: 0.2500 mg | PEN_INJECTOR | SUBCUTANEOUS | 1 refills | Status: DC

## 2022-10-14 MED ORDER — LOSARTAN POTASSIUM 100 MG PO TABS: 100.0000 mg | ORAL_TABLET | Freq: Every day | ORAL | 2 refills | Status: DC

## 2022-10-14 NOTE — Progress Notes (Signed)
Subjective:    Patient ID: Lorraine Mcdaniel, female    DOB: February 04, 1966, 57 y.o.   MRN: [id number]  Chief Complaint  Patient presents with   fluid behind ear    Right ear    Medical Management of Chronic Issues    Wants to discuss wegovy     Pt presents to the office today for CPE without pap. She is followed by her NP at work every 3 months and gets lab work completed there.  She is prediabetic and her A1C was 6.3.   Wants to discuss starting Wegovy.  Hypertension This is a chronic problem. The current episode started more than 1 year ago. The problem has been resolved since onset. The problem is controlled. Associated symptoms include anxiety and malaise/fatigue. Pertinent negatives include no peripheral edema or shortness of breath. Risk factors for coronary artery disease include dyslipidemia, obesity and sedentary lifestyle. The current treatment provides moderate improvement.  Gastroesophageal Reflux She complains of belching and heartburn. This is a chronic problem. The current episode started more than 1 year ago. The problem occurs occasionally. Risk factors include obesity. She has tried an antacid for the symptoms. The treatment provided moderate relief.  Hyperlipidemia This is a chronic problem. The current episode started more than 1 year ago. The problem is controlled. Exacerbating diseases include obesity. Pertinent negatives include no shortness of breath. Current antihyperlipidemic treatment includes statins. The current treatment provides moderate improvement of lipids. Risk factors for coronary artery disease include dyslipidemia, hypertension and a sedentary lifestyle.  Anxiety Presents for follow-up visit. Symptoms include depressed mood, excessive worry, nervous/anxious behavior and restlessness. Patient reports no shortness of breath. Symptoms occur occasionally. The severity of symptoms is moderate.    Nicotine Dependence Presents for follow-up visit. Her urge triggers  include company of smokers. The symptoms have been stable. She smokes 1 pack of cigarettes per day.      Review of Systems  Constitutional:  Positive for malaise/fatigue.  Respiratory:  Negative for shortness of breath.   Gastrointestinal:  Positive for heartburn.  Psychiatric/Behavioral:  The patient is nervous/anxious.   All other systems reviewed and are negative.   Family History  Problem Relation Age of Onset   Heart disease Mother    Arthritis Mother    Asthma Mother    Allergic rhinitis Mother    Stroke Father    Hypertension Father    Heart disease Father    Breast cancer Sister    Cancer Sister        breast cancer in 2's   Breast cancer Sister    Social History   Socioeconomic History   Marital status: Married    Spouse name: Not on file   Number of children: Not on file   Years of education: Not on file   Highest education level: Not on file  Occupational History   Not on file  Tobacco Use   Smoking status: Every Day    Packs/day: 1    Types: Cigarettes    Start date: 11/23/2016   Smokeless tobacco: Never  Vaping Use   Vaping Use: Never used  Substance and Sexual Activity   Alcohol use: Yes    Comment: occasionally   Drug use: No   Sexual activity: Not on file  Other Topics Concern   Not on file  Social History Narrative   Not on file   Social Determinants of Health   Financial Resource Strain: Patient Declined (10/14/2022)   Overall Physicist, medical Strain (  CARDIA)    Difficulty of Paying Living Expenses: Patient declined  Food Insecurity: No Food Insecurity (10/14/2022)   Hunger Vital Sign    Worried About Running Out of Food in the Last Year: Never true    Ran Out of Food in the Last Year: Never true  Transportation Needs: No Transportation Needs (10/14/2022)   PRAPARE - Administrator, Civil Service (Medical): No    Lack of Transportation (Non-Medical): No  Physical Activity: Inactive (10/14/2022)   Exercise Vital Sign     Days of Exercise per Week: 0 days    Minutes of Exercise per Session: 0 min  Stress: No Stress Concern Present (10/14/2022)   [employer] of Occupational Health - Occupational Stress Questionnaire    Feeling of Stress : Only a little  Social Connections: Socially Integrated (10/14/2022)   Social Connection and Isolation Panel [NHANES]    Frequency of Communication with Friends and Family: More than three times a week    Frequency of Social Gatherings with Friends and Family: More than three times a week    Attends Religious Services: More than 4 times per year    Active Member of [employer] or Organizations: No    Attends Engineer, structural: 1 to 4 times per year    Marital Status: Married       Objective:   Physical Exam Vitals reviewed.  Constitutional:      General: She is not in acute distress.    Appearance: She is well-developed. She is obese.  HENT:     Head: Normocephalic and atraumatic.     Right Ear: Tympanic membrane normal.     Left Ear: Tympanic membrane normal.  Eyes:     Pupils: Pupils are equal, round, and reactive to light.  Neck:     Thyroid: No thyromegaly.  Cardiovascular:     Rate and Rhythm: Normal rate and regular rhythm.     Heart sounds: Normal heart sounds. No murmur heard. Pulmonary:     Effort: Pulmonary effort is normal. No respiratory distress.     Breath sounds: Normal breath sounds. No wheezing.  Abdominal:     General: Bowel sounds are normal. There is no distension.     Palpations: Abdomen is soft.     Tenderness: There is no abdominal tenderness.  Musculoskeletal:        General: No tenderness. Normal range of motion.     Cervical back: Normal range of motion and neck supple.  Skin:    General: Skin is warm and dry.  Neurological:     Mental Status: She is alert and oriented to person, place, and time.     Cranial Nerves: No cranial nerve deficit.     Deep Tendon Reflexes: Reflexes are normal and symmetric.  Psychiatric:         Behavior: Behavior normal.        Thought Content: Thought content normal.        Judgment: Judgment normal.     BP 119/78   Pulse 78   Temp 97.8 F (36.6 C) (Temporal)   Ht 5\' 4"  (1.626 m)   Wt 198 lb (89.8 kg)   SpO2 96%   BMI 33.99 kg/m      Assessment & Plan:   Lorraine Mcdaniel comes in today with chief complaint of fluid behind ear (Right ear ) and Medical Management of Chronic Issues (Wants to discuss wegovy )   Diagnosis and orders addressed:  1. GAD (  generalized anxiety disorder) - ALPRAZolam (XANAX) 0.25 MG tablet; Take 1 tablet (0.25 mg total) by mouth 2 (two) times daily as needed for anxiety.  Dispense: 30 tablet; Refill: 2  2. Essential hypertension - losartan (COZAAR) 100 MG tablet; Take 1 tablet (100 mg total) by mouth daily.  Dispense: 90 tablet; Refill: 2  3. Controlled substance agreement signed - ToxASSURE Select 13 (MW), Urine  4. Current smoker  5. Gastroesophageal reflux disease, unspecified whether esophagitis present   6. Hyperlipidemia, unspecified hyperlipidemia type   7. Vitamin D deficiency  8. Weight loss counseling, encounter for - Semaglutide,0.25 or 0.5MG /DOS, 2 MG/3ML SOPN; Inject 0.25 mg into the skin once a week.  Dispense: 3 mL; Refill: 1  9. Class 1 obesity due to excess calories with serious comorbidity and body mass index (BMI) of 33.0 to 33.9 in adult  - Semaglutide,0.25 or 0.5MG /DOS, 2 MG/3ML SOPN; Inject 0.25 mg into the skin once a week.  Dispense: 3 mL; Refill: 1  10. Prediabetes - Semaglutide,0.25 or 0.5MG /DOS, 2 MG/3ML SOPN; Inject 0.25 mg into the skin once a week.  Dispense: 3 mL; Refill: 1  11. Colon cancer screening - Cologuard   Labs reviewed from work  Start [employer] 0.25 mg  Healthy diet and exercise  Health Maintenance reviewed Diet and exercise encouraged  Follow up plan: 2 months to recheck weight loss   Jannifer Rodney, FNP

## 2022-10-14 NOTE — Patient Instructions (Signed)
Health Maintenance After Age 58 After age 58, you are at a higher risk for certain long-term diseases and infections as well as injuries from falls. Falls are a major cause of broken bones and head injuries in people who are older than age 58. Getting regular preventive care can help to keep you healthy and well. Preventive care includes getting regular testing and making lifestyle changes as recommended by your health care provider. Talk with your health care provider about: Which screenings and tests you should have. A screening is a test that checks for a disease when you have no symptoms. A diet and exercise plan that is right for you. What should I know about screenings and tests to prevent falls? Screening and testing are the best ways to find a health problem early. Early diagnosis and treatment give you the best chance of managing medical conditions that are common after age 58. Certain conditions and lifestyle choices may make you more likely to have a fall. Your health care provider may recommend: Regular vision checks. Poor vision and conditions such as cataracts can make you more likely to have a fall. If you wear glasses, make sure to get your prescription updated if your vision changes. Medicine review. Work with your health care provider to regularly review all of the medicines you are taking, including over-the-counter medicines. Ask your health care provider about any side effects that may make you more likely to have a fall. Tell your health care provider if any medicines that you take make you feel dizzy or sleepy. Strength and balance checks. Your health care provider may recommend certain tests to check your strength and balance while standing, walking, or changing positions. Foot health exam. Foot pain and numbness, as well as not wearing proper footwear, can make you more likely to have a fall. Screenings, including: Osteoporosis screening. Osteoporosis is a condition that causes  the bones to get weaker and break more easily. Blood pressure screening. Blood pressure changes and medicines to control blood pressure can make you feel dizzy. Depression screening. You may be more likely to have a fall if you have a fear of falling, feel depressed, or feel unable to do activities that you used to do. Alcohol use screening. Using too much alcohol can affect your balance and may make you more likely to have a fall. Follow these instructions at home: Lifestyle Do not drink alcohol if: Your health care provider tells you not to drink. If you drink alcohol: Limit how much you have to: 0-1 drink a day for women. 0-2 drinks a day for men. Know how much alcohol is in your drink. In the [place] one drink equals one 12 oz bottle of beer (355 mL), one 5 oz glass of wine (148 mL), or one 1 oz glass of hard liquor (44 mL). Do not use any products that contain nicotine or tobacco. These products include cigarettes, chewing tobacco, and vaping devices, such as e-cigarettes. If you need help quitting, ask your health care provider. Activity  Follow a regular exercise program to stay fit. This will help you maintain your balance. Ask your health care provider what types of exercise are appropriate for you. If you need a cane or walker, use it as recommended by your health care provider. Wear supportive shoes that have nonskid soles. Safety  Remove any tripping hazards, such as rugs, cords, and clutter. Install safety equipment such as grab bars in bathrooms and safety rails on stairs. Keep rooms and walkways   well-lit. General instructions Talk with your health care provider about your risks for falling. Tell your health care provider if: You fall. Be sure to tell your health care provider about all falls, even ones that seem minor. You feel dizzy, tiredness (fatigue), or off-balance. Take over-the-counter and prescription medicines only as told by your health care provider. These include  supplements. Eat a healthy diet and maintain a healthy weight. A healthy diet includes low-fat dairy products, low-fat (lean) meats, and fiber from whole grains, beans, and lots of fruits and vegetables. Stay current with your vaccines. Schedule regular health, dental, and eye exams. Summary Having a healthy lifestyle and getting preventive care can help to protect your health and wellness after age 58. Screening and testing are the best way to find a health problem early and help you avoid having a fall. Early diagnosis and treatment give you the best chance for managing medical conditions that are more common for people who are older than age 58. Falls are a major cause of broken bones and head injuries in people who are older than age 58. Take precautions to prevent a fall at home. Work with your health care provider to learn what changes you can make to improve your health and wellness and to prevent falls. This information is not intended to replace advice given to you by your health care provider. Make sure you discuss any questions you have with your health care provider. Document Revised: 08/31/2020 Document Reviewed: 08/31/2020 Elsevier Patient Education  2024 Elsevier Inc.  

## 2022-10-17 ENCOUNTER — Telehealth: Payer: Self-pay | Admitting: Family

## 2022-10-17 DIAGNOSIS — Z713 Dietary counseling and surveillance: Secondary | ICD-10-CM

## 2022-10-17 DIAGNOSIS — R7303 Prediabetes: Secondary | ICD-10-CM

## 2022-10-17 DIAGNOSIS — E6609 Other obesity due to excess calories: Secondary | ICD-10-CM

## 2022-10-17 MED ORDER — SEMAGLUTIDE(0.25 OR 0.5MG/DOS) 2 MG/3ML ~~LOC~~ SOPN: 0.2500 mg | PEN_INJECTOR | SUBCUTANEOUS | 1 refills | Status: DC

## 2022-10-17 NOTE — Telephone Encounter (Signed)
Patient aware and verbalizes understanding. 

## 2022-10-17 NOTE — Telephone Encounter (Signed)
I sent in [facility]. However, the both have same generic name. I have resent and wrote name in box to give [facility].   Jannifer Rodney, FNP

## 2022-10-20 ENCOUNTER — Other Ambulatory Visit (HOSPITAL_COMMUNITY): Payer: Self-pay

## 2022-10-20 ENCOUNTER — Telehealth: Payer: Self-pay | Admitting: Pharmacy Technician

## 2022-10-20 LAB — TOXASSURE SELECT 13 (MW), URINE

## 2022-10-20 NOTE — Telephone Encounter (Signed)
Pharmacy Patient Advocate Encounter   Received notification from CoverMyMeds that prior authorization for [facility] 0.25MG /0.5ML auto-injectors is required/requested.    PA submitted to [facility] via CoverMyMeds Key/confirmation #/[id number] Status is pending

## 2022-10-22 ENCOUNTER — Other Ambulatory Visit: Payer: Self-pay | Admitting: Family

## 2022-10-22 DIAGNOSIS — E6609 Other obesity due to excess calories: Secondary | ICD-10-CM

## 2022-10-22 DIAGNOSIS — R7303 Prediabetes: Secondary | ICD-10-CM

## 2022-10-22 DIAGNOSIS — Z713 Dietary counseling and surveillance: Secondary | ICD-10-CM

## 2022-10-24 NOTE — Telephone Encounter (Signed)
WEGOVY 0.25 MG/0.5ML SOAJ        Changed from: Semaglutide,0.25 or 0.5MG /DOS, 2 MG/3ML SOPN    Pharmacy comment: for insurance purposes, [name] needs to be the product listed inside the sig, not in the notes please correct and resend thanks [facility].

## 2022-10-26 NOTE — Telephone Encounter (Signed)
Lorraine Mcdaniel (Key: [id number]) Rx #: [id number] BJYNWG 0.25MG /0.5ML auto-injectors Form [facility] Electronic Prior Authorization Form (2017 NCPDP) Created 9 days ago Sent to Plan 6 days ago Plan Response 6 days ago Submit Clinical Questions 6 days ago Determination Favorable 6 days ago Your prior authorization for [name] has been approved! MORE INFO Personalized support and financial assistance may be available through the [employer] program. For more information, and to see program requirements, click on the More Info button to the right.  Message from plan: Request Reference Number: [id number]. WEGOVY INJ 0.25MG  is approved through 05/22/2023. Your patient may now fill this prescription and it will be covered.. Authorization Expiration Date: May 22, 2023.  Pharmacy aware

## 2022-12-02 DIAGNOSIS — H40053 Ocular hypertension, bilateral: Secondary | ICD-10-CM | POA: Diagnosis not present

## 2022-12-15 ENCOUNTER — Ambulatory Visit: Payer: BC Managed Care – PPO | Admitting: Family

## 2022-12-15 ENCOUNTER — Encounter: Payer: Self-pay | Admitting: Family

## 2022-12-15 VITALS — BP 129/78 | HR 77 | Temp 96.7°F | Ht 64.0 in | Wt 191.8 lb

## 2022-12-15 DIAGNOSIS — I10 Essential (primary) hypertension: Secondary | ICD-10-CM | POA: Diagnosis not present

## 2022-12-15 DIAGNOSIS — R7303 Prediabetes: Secondary | ICD-10-CM | POA: Diagnosis not present

## 2022-12-15 DIAGNOSIS — E669 Obesity, unspecified: Secondary | ICD-10-CM

## 2022-12-15 NOTE — Progress Notes (Signed)
Subjective:    Patient ID: Lorraine Mcdaniel, female    DOB: 1965/11/23, 57 y.o.   MRN: [id number]  Chief Complaint  Patient presents with   Medical Management of Chronic Issues    NO LONGER TAKEN THE WEGOVY.    Pt presents to the office today for chronic follow up. She is followed by her NP at work every 3 months and gets lab work completed there.  She is prediabetic and her A1C was 6.3.    She started the [facility] 0.25 mg weekly for one month then increased to 0.5 mg. Reports she started feeling shaky and thought she might pass out.  Diabetes She presents for her follow-up diabetic visit. She has type 2 diabetes mellitus. Pertinent negatives for diabetes include no blurred vision and no foot paresthesias. Symptoms are stable. Risk factors for coronary artery disease include dyslipidemia, diabetes mellitus, hypertension, sedentary lifestyle and post-menopausal. She is following a generally healthy diet. (Does not check glucose at home) Eye exam is current.  Hypertension This is a chronic problem. The current episode started more than 1 year ago. The problem has been resolved since onset. The problem is controlled. Pertinent negatives include no blurred vision, malaise/fatigue, peripheral edema or shortness of breath. Risk factors for coronary artery disease include dyslipidemia, diabetes mellitus and sedentary lifestyle. The current treatment provides moderate improvement.  Nicotine Dependence Presents for follow-up visit. Her urge triggers include company of smokers. The symptoms have been stable. She smokes 1 pack of cigarettes per day.      Review of Systems  Constitutional:  Negative for malaise/fatigue.  Eyes:  Negative for blurred vision.  Respiratory:  Negative for shortness of breath.   All other systems reviewed and are negative.      Objective:   Physical Exam Vitals reviewed.  Constitutional:      General: She is not in acute distress.    Appearance: She is well-developed.   HENT:     Head: Normocephalic and atraumatic.     Right Ear: Tympanic membrane normal.     Left Ear: Tympanic membrane normal.  Eyes:     Pupils: Pupils are equal, round, and reactive to light.  Neck:     Thyroid: No thyromegaly.  Cardiovascular:     Rate and Rhythm: Normal rate and regular rhythm.     Heart sounds: Normal heart sounds. No murmur heard. Pulmonary:     Effort: Pulmonary effort is normal. No respiratory distress.     Breath sounds: Normal breath sounds. No wheezing.  Abdominal:     General: Bowel sounds are normal. There is no distension.     Palpations: Abdomen is soft.     Tenderness: There is no abdominal tenderness.  Musculoskeletal:        General: No tenderness. Normal range of motion.     Cervical back: Normal range of motion and neck supple.  Skin:    General: Skin is warm and dry.  Neurological:     Mental Status: She is alert and oriented to person, place, and time.     Cranial Nerves: No cranial nerve deficit.     Deep Tendon Reflexes: Reflexes are normal and symmetric.  Psychiatric:        Behavior: Behavior normal.        Thought Content: Thought content normal.        Judgment: Judgment normal.       BP 129/78   Pulse 77   Temp (!) 96.7 F (35.9 C) (  Temporal)   Ht 5\' 4"  (1.626 m)   Wt 191 lb 12.8 oz (87 kg)   SpO2 94%   BMI 32.92 kg/m      Assessment & Plan:  Lorraine Mcdaniel comes in today with chief complaint of Medical Management of Chronic Issues (NO LONGER TAKEN THE WEGOVY. )   Diagnosis and orders addressed:  1. Prediabetes  2. Essential hypertension  3. Obesity (BMI 30-39.9)    Pt scheduled to get "full panel" at work and wants to hold off on labs at this time.  States she is going to join they gym and hold off on [place] at this time  Health Maintenance reviewed Diet and exercise encouraged  Follow up plan: 6 months    Jannifer Rodney, FNP

## 2022-12-15 NOTE — Patient Instructions (Signed)
Calorie Counting for Weight Loss Calories are units of energy. Your body needs a certain number of calories from food to keep going throughout the day. When you eat or drink more calories than your body needs, your body stores the extra calories mostly as fat. When you eat or drink fewer calories than your body needs, your body burns fat to get the energy it needs. Calorie counting means keeping track of how many calories you eat and drink each day. Calorie counting can be helpful if you need to lose weight. If you eat fewer calories than your body needs, you should lose weight. Ask your health care provider what a healthy weight is for you. For calorie counting to work, you will need to eat the right number of calories each day to lose a healthy amount of weight per week. A dietitian can help you figure out how many calories you need in a day and will suggest ways to reach your calorie goal. A healthy amount of weight to lose each week is usually 1-2 lb (0.5-0.9 kg). This usually means that your daily calorie intake should be reduced by 500-750 calories. Eating 1,200-1,500 calories a day can help most women lose weight. Eating 1,500-1,800 calories a day can help most men lose weight. What do I need to know about calorie counting? Work with your health care provider or dietitian to determine how many calories you should get each day. To meet your daily calorie goal, you will need to: Find out how many calories are in each food that you would like to eat. Try to do this before you eat. Decide how much of the food you plan to eat. Keep a food log. Do this by writing down what you ate and how many calories it had. To successfully lose weight, it is important to balance calorie counting with a healthy lifestyle that includes regular activity. Where do I find calorie information?  The number of calories in a food can be found on a Nutrition Facts label. If a food does not have a Nutrition Facts label, try  to look up the calories online or ask your dietitian for help. Remember that calories are listed per serving. If you choose to have more than one serving of a food, you will have to multiply the calories per serving by the number of servings you plan to eat. For example, the label on a package of bread might say that a serving size is 1 slice and that there are 90 calories in a serving. If you eat 1 slice, you will have eaten 90 calories. If you eat 2 slices, you will have eaten 180 calories. How do I keep a food log? After each time that you eat, record the following in your food log as soon as possible: What you ate. Be sure to include toppings, sauces, and other extras on the food. How much you ate. This can be measured in cups, ounces, or number of items. How many calories were in each food and drink. The total number of calories in the food you ate. Keep your food log near you, such as in a pocket-sized notebook or on an app or website on your mobile phone. Some programs will calculate calories for you and show you how many calories you have left to meet your daily goal. What are some portion-control tips? Know how many calories are in a serving. This will help you know how many servings you can have of a certain   food. Use a measuring cup to measure serving sizes. You could also try weighing out portions on a kitchen scale. With time, you will be able to estimate serving sizes for some foods. Take time to put servings of different foods on your favorite plates or in your favorite bowls and cups so you know what a serving looks like. Try not to eat straight from a food's packaging, such as from a bag or box. Eating straight from the package makes it hard to see how much you are eating and can lead to overeating. Put the amount you would like to eat in a cup or on a plate to make sure you are eating the right portion. Use smaller plates, glasses, and bowls for smaller portions and to prevent  overeating. Try not to multitask. For example, avoid watching TV or using your computer while eating. If it is time to eat, sit down at a table and enjoy your food. This will help you recognize when you are full. It will also help you be more mindful of what and how much you are eating. What are tips for following this plan? Reading food labels Check the calorie count compared with the serving size. The serving size may be smaller than what you are used to eating. Check the source of the calories. Try to choose foods that are high in protein, fiber, and vitamins, and low in saturated fat, trans fat, and sodium. Shopping Read nutrition labels while you shop. This will help you make healthy decisions about which foods to buy. Pay attention to nutrition labels for low-fat or fat-free foods. These foods sometimes have the same number of calories or more calories than the full-fat versions. They also often have added sugar, starch, or salt to make up for flavor that was removed with the fat. Make a grocery list of lower-calorie foods and stick to it. Cooking Try to cook your favorite foods in a healthier way. For example, try baking instead of frying. Use low-fat dairy products. Meal planning Use more fruits and vegetables. One-half of your plate should be fruits and vegetables. Include lean proteins, such as chicken, turkey, and fish. Lifestyle Each week, aim to do one of the following: 150 minutes of moderate exercise, such as walking. 75 minutes of vigorous exercise, such as running. General information Know how many calories are in the foods you eat most often. This will help you calculate calorie counts faster. Find a way of tracking calories that works for you. Get creative. Try different apps or programs if writing down calories does not work for you. What foods should I eat?  Eat nutritious foods. It is better to have a nutritious, high-calorie food, such as an avocado, than a food with  few nutrients, such as a bag of potato chips. Use your calories on foods and drinks that will fill you up and will not leave you hungry soon after eating. Examples of foods that fill you up are nuts and nut butters, vegetables, lean proteins, and high-fiber foods such as whole grains. High-fiber foods are foods with more than 5 g of fiber per serving. Pay attention to calories in drinks. Low-calorie drinks include water and unsweetened drinks. The items listed above may not be a complete list of foods and beverages you can eat. Contact a dietitian for more information. What foods should I limit? Limit foods or drinks that are not good sources of vitamins, minerals, or protein or that are high in unhealthy fats. These   include: Candy. Other sweets. Sodas, specialty coffee drinks, alcohol, and juice. The items listed above may not be a complete list of foods and beverages you should avoid. Contact a dietitian for more information. How do I count calories when eating out? Pay attention to portions. Often, portions are much larger when eating out. Try these tips to keep portions smaller: Consider sharing a meal instead of getting your own. If you get your own meal, eat only half of it. Before you start eating, ask for a container and put half of your meal into it. When available, consider ordering smaller portions from the menu instead of full portions. Pay attention to your food and drink choices. Knowing the way food is cooked and what is included with the meal can help you eat fewer calories. If calories are listed on the menu, choose the lower-calorie options. Choose dishes that include vegetables, fruits, whole grains, low-fat dairy products, and lean proteins. Choose items that are boiled, broiled, grilled, or steamed. Avoid items that are buttered, battered, fried, or served with cream sauce. Items labeled as crispy are usually fried, unless stated otherwise. Choose water, low-fat milk,  unsweetened iced tea, or other drinks without added sugar. If you want an alcoholic beverage, choose a lower-calorie option, such as a glass of wine or light beer. Ask for dressings, sauces, and syrups on the side. These are usually high in calories, so you should limit the amount you eat. If you want a salad, choose a garden salad and ask for grilled meats. Avoid extra toppings such as bacon, cheese, or fried items. Ask for the dressing on the side, or ask for olive oil and vinegar or lemon to use as dressing. Estimate how many servings of a food you are given. Knowing serving sizes will help you be aware of how much food you are eating at restaurants. Where to find more information Centers for Disease Control and Prevention: www.cdc.gov U.S. Department of Agriculture: myplate.gov Summary Calorie counting means keeping track of how many calories you eat and drink each day. If you eat fewer calories than your body needs, you should lose weight. A healthy amount of weight to lose per week is usually 1-2 lb (0.5-0.9 kg). This usually means reducing your daily calorie intake by 500-750 calories. The number of calories in a food can be found on a Nutrition Facts label. If a food does not have a Nutrition Facts label, try to look up the calories online or ask your dietitian for help. Use smaller plates, glasses, and bowls for smaller portions and to prevent overeating. Use your calories on foods and drinks that will fill you up and not leave you hungry shortly after a meal. This information is not intended to replace advice given to you by your health care provider. Make sure you discuss any questions you have with your health care provider. Document Revised: 05/23/2019 Document Reviewed: 05/23/2019 Elsevier Patient Education  2023 Elsevier Inc.  

## 2023-04-06 DIAGNOSIS — H40013 Open angle with borderline findings, low risk, bilateral: Secondary | ICD-10-CM | POA: Diagnosis not present

## 2023-04-13 ENCOUNTER — Other Ambulatory Visit: Payer: Self-pay | Admitting: Family

## 2023-04-13 DIAGNOSIS — Z1231 Encounter for screening mammogram for malignant neoplasm of breast: Secondary | ICD-10-CM

## 2023-04-14 ENCOUNTER — Ambulatory Visit
Admission: RE | Admit: 2023-04-14 | Discharge: 2023-04-14 | Disposition: A | Discharge status: Home / Self Care | Payer: BC Managed Care – PPO | Source: Ambulatory Visit | Attending: Family | Admitting: Family

## 2023-04-14 DIAGNOSIS — Z1231 Encounter for screening mammogram for malignant neoplasm of breast: Secondary | ICD-10-CM

## 2023-04-27 ENCOUNTER — Telehealth: Payer: Self-pay | Admitting: Family Medicine

## 2023-04-27 NOTE — Telephone Encounter (Signed)
 Called patient she only wanted mammo results and results given.

## 2023-04-27 NOTE — Telephone Encounter (Signed)
 Copied from CRM [id number]. Topic: Clinical - Lab/Test Results >> Apr 25, 2023  3:47 PM Antony Haste wrote: Reason for CRM: the patient wanted to check on her lab results for recent mammogram completed on 12/20.  Callback #: [phone]

## 2023-07-25 ENCOUNTER — Other Ambulatory Visit: Payer: Self-pay | Admitting: Family

## 2023-07-25 DIAGNOSIS — I10 Essential (primary) hypertension: Secondary | ICD-10-CM

## 2023-08-03 ENCOUNTER — Telehealth (HOSPITAL_COMMUNITY): Payer: Self-pay | Admitting: *Deleted

## 2023-08-03 NOTE — Telephone Encounter (Signed)
 Refill Request  busPIRone (BUSPAR) 7.5 MG tablet   Next Appt 09/27/23 Last Appt  07/20/23

## 2023-08-09 LAB — LAB REPORT - SCANNED
A1c: 6
EGFR: 77

## 2023-08-21 ENCOUNTER — Other Ambulatory Visit: Payer: Self-pay | Admitting: Family

## 2023-08-21 DIAGNOSIS — I10 Essential (primary) hypertension: Secondary | ICD-10-CM

## 2023-08-22 NOTE — Telephone Encounter (Signed)
 Christy pt NTBS 30-d given 07/26/23 NO RF sent to mail order

## 2023-08-22 NOTE — Telephone Encounter (Signed)
 Apt scheduled.

## 2023-09-22 ENCOUNTER — Ambulatory Visit: Admitting: Family

## 2023-10-02 ENCOUNTER — Telehealth: Payer: Self-pay | Admitting: Family

## 2023-10-02 NOTE — Telephone Encounter (Signed)
Spoke with patient and made her aware. Patient voiced understanding.

## 2023-10-02 NOTE — Telephone Encounter (Signed)
 Please advise.   Copied from CRM [id number]. Topic: Appointments - Appointment Info/Confirmation >> Oct 02, 2023  8:40 AM Madelyne Schiff wrote: Patient Boot, would like to know if the results from her other provider can be used to receive her medication instead of coming in June 12 for a visit ... Please reference 08/17/23 [facility] Office Visit   Please advise.

## 2023-10-05 ENCOUNTER — Encounter: Payer: Self-pay | Admitting: Family

## 2023-10-05 ENCOUNTER — Ambulatory Visit: Admitting: Family

## 2023-10-05 VITALS — BP 106/70 | HR 67 | Temp 97.7°F | Ht 64.0 in | Wt 178.2 lb

## 2023-10-05 DIAGNOSIS — Z0001 Encounter for general adult medical examination with abnormal findings: Secondary | ICD-10-CM | POA: Diagnosis not present

## 2023-10-05 DIAGNOSIS — Z0000 Encounter for general adult medical examination without abnormal findings: Secondary | ICD-10-CM

## 2023-10-05 DIAGNOSIS — Z79899 Other long term (current) drug therapy: Secondary | ICD-10-CM

## 2023-10-05 DIAGNOSIS — F411 Generalized anxiety disorder: Secondary | ICD-10-CM | POA: Diagnosis not present

## 2023-10-05 DIAGNOSIS — F17200 Nicotine dependence, unspecified, uncomplicated: Secondary | ICD-10-CM

## 2023-10-05 DIAGNOSIS — I10 Essential (primary) hypertension: Secondary | ICD-10-CM | POA: Diagnosis not present

## 2023-10-05 DIAGNOSIS — E785 Hyperlipidemia, unspecified: Secondary | ICD-10-CM

## 2023-10-05 DIAGNOSIS — Z6841 Body Mass Index (BMI) 40.0 and over, adult: Secondary | ICD-10-CM

## 2023-10-05 DIAGNOSIS — E669 Obesity, unspecified: Secondary | ICD-10-CM

## 2023-10-05 DIAGNOSIS — R7303 Prediabetes: Secondary | ICD-10-CM

## 2023-10-05 DIAGNOSIS — K219 Gastro-esophageal reflux disease without esophagitis: Secondary | ICD-10-CM

## 2023-10-05 MED ORDER — LOSARTAN POTASSIUM 100 MG PO TABS: 100.0000 mg | ORAL_TABLET | Freq: Every day | ORAL | 3 refills | Status: AC

## 2023-10-05 MED ORDER — ALPRAZOLAM 0.25 MG PO TABS: 0.2500 mg | ORAL_TABLET | Freq: Two times a day (BID) | ORAL | 2 refills | Status: DC | PRN

## 2023-10-05 NOTE — Patient Instructions (Signed)
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
## 2023-11-07 ENCOUNTER — Other Ambulatory Visit: Payer: Self-pay | Admitting: Family

## 2023-11-07 DIAGNOSIS — F411 Generalized anxiety disorder: Secondary | ICD-10-CM

## 2023-11-07 DIAGNOSIS — Z79899 Other long term (current) drug therapy: Secondary | ICD-10-CM

## 2023-11-07 MED ORDER — ALPRAZOLAM 0.25 MG PO TABS: 0.2500 mg | ORAL_TABLET | Freq: Two times a day (BID) | ORAL | 1 refills | Status: AC | PRN

## 2023-11-07 NOTE — Telephone Encounter (Signed)
 Would like the Alprazolam  to go back to [facility] and to go back to 20 tablets

## 2023-11-07 NOTE — Telephone Encounter (Signed)
 Copied from CRM [id number]. Topic: General - Other >> Nov 07, 2023 10:06 AM Turkey B wrote: Reason for CRM: PT called in states, wondered why Dr lavell sent the Xanax  med thru [facility]. Pt states she doesn't want to have it sent thru [facility] anymore, because she has teens at home that may get it.

## 2023-11-27 ENCOUNTER — Encounter: Payer: Self-pay | Admitting: Family Medicine

## 2023-11-27 ENCOUNTER — Ambulatory Visit: Admitting: Family Medicine

## 2023-11-27 ENCOUNTER — Ambulatory Visit: Payer: Self-pay

## 2023-11-27 VITALS — BP 132/83 | HR 94 | Temp 98.3°F | Ht 64.0 in | Wt 177.8 lb

## 2023-11-27 DIAGNOSIS — L237 Allergic contact dermatitis due to plants, except food: Secondary | ICD-10-CM

## 2023-11-27 MED ORDER — TRIAMCINOLONE ACETONIDE 0.5 % EX OINT: 1.0000 | TOPICAL_OINTMENT | Freq: Two times a day (BID) | CUTANEOUS | 0 refills | Status: AC

## 2023-11-27 MED ORDER — HYDROXYZINE PAMOATE 25 MG PO CAPS: 25.0000 mg | ORAL_CAPSULE | Freq: Three times a day (TID) | ORAL | 0 refills | Status: AC | PRN

## 2023-11-27 NOTE — Telephone Encounter (Signed)
 Noted appt with [facility] today 11/27/23

## 2023-11-27 NOTE — Telephone Encounter (Signed)
 FYI Only or Action Required?: FYI only for provider.  Patient was last seen in primary care on 10/05/2023 by Lavell Bari LABOR, FNP.  Called Nurse Triage reporting [facility].  Symptoms began several days ago.  Interventions attempted: OTC medications: calamine lotion with some relief.  Symptoms are: gradually worsening.  Triage Disposition: See HCP Within 4 Hours (Or PCP Triage)  Patient/caregiver understands and will follow disposition?: Yes      Copied from CRM #[id number]. Topic: Clinical - Red Word Triage >> Nov 27, 2023  8:18 AM Lorraine Mcdaniel wrote: Red Word that prompted transfer to Nurse Triage: Patient states she has poison oak all over her. States she walked her land on Friday and it started coming up on her Saturday. She states that she put calamine lotion on and then she started shaking. Washed it off and reapplied it every two hours. Patient states she hasn't had any sleep. Wants to see someone today, if possible. Reason for Disposition  [1] Severe poison ivy, oak, or sumac reaction in the past AND [2] face or genitals involved  Answer Assessment - Initial Assessment Questions 1. APPEARANCE of RASH: What does the rash look like?      Red, and open on ankles 2. LOCATION: Where is the rash located?  (e.g., face, genitals, hands, legs)     Ankles, knees, hairline, groin, arms and back 3. SIZE: How large is the rash?      All around ankles, generalized spots 4. ONSET: When did the rash begin?      Friday  5. ITCHING: Does the rash itch? If Yes, ask: How bad is it?     yes 6. EXPOSURE:  How were you exposed to the plant (poison ivy, poison oak, sumac)  When were you exposed?  Note: Sometimes a poison ivy/oak/sumac rash does not appear for 2 to 3 weeks after exposure.      Oak/ivy 7. PAST HISTORY: Have you had a poison ivy rash before? If Yes, ask: How bad was it?     Yes, endorses issues with face and breaking out 8. OTHER SYMPTOMS: Do you have any other  symptoms? (e.g., fever)      denies 9. PREGNANCY: Is there any chance you are pregnant? When was your last menstrual period?     N/a  Protocols used: Poison Ivy - Oak - Sumac-A-AH

## 2023-11-27 NOTE — Progress Notes (Signed)
   Acute Office Visit  Subjective:     Patient ID: Lorraine Mcdaniel, female    DOB: April 25, 1966, 58 y.o.   MRN: [id number]  Chief Complaint  Patient presents with   Dermatitis    HPI Patient is in today for a rash. Rash started 2 days ago after walking her land the day prior. Rash is itchy, bumpy with some fluid filled blisters. Rash is on bilateral lower legs, pelvic region and one spot of the left side of her neck. She has been using calamine and OTC anti itch cream, and zyrtec  with minimal relief. She has been trying to avoid scratching. She has an allergy  to prednisone .   ROS As per HPI.      Objective:    BP 132/83   Pulse 94   Temp 98.3 F (36.8 C) (Temporal)   Ht 5' 4 (1.626 m)   Wt 177 lb 12.8 oz (80.6 kg)   SpO2 97%   BMI 30.52 kg/m    Physical Exam Vitals and nursing note reviewed.  Constitutional:      General: She is not in acute distress.    Appearance: She is not ill-appearing, toxic-appearing or diaphoretic.  HENT:     Mouth/Throat:     Mouth: No angioedema.  Cardiovascular:     Rate and Rhythm: Regular rhythm.  Pulmonary:     Effort: Pulmonary effort is normal. No respiratory distress.  Musculoskeletal:        General: No swelling.     Right lower leg: No edema.     Left lower leg: No edema.  Skin:    General: Skin is dry.     Findings: Rash present.     Comments: Erythematous papular and vesicular rash to bilateral lower extremities. Some in linear pattern, some scattered.   Neurological:     Mental Status: She is alert.     No results found for any visits on 11/27/23.      Assessment & Plan:   Lorraine Mcdaniel was seen today for dermatitis.  Diagnoses and all orders for this visit:  Plant allergic contact dermatitis Reports allergy  to oral/IM steroids. Kenalog  cream as below. Do not apply to face or genitals. Can take hydroxyzine  prn. Return to office for new or worsening symptoms, or if symptoms persist.  -     triamcinolone  ointment (KENALOG )  0.5 %; Apply 1 Application topically 2 (two) times daily. -     hydrOXYzine  (VISTARIL ) 25 MG capsule; Take 1 capsule (25 mg total) by mouth every 8 (eight) hours as needed.  The patient indicates understanding of these issues and agrees with the plan.  Lorraine CHRISTELLA Search, FNP

## 2023-12-01 ENCOUNTER — Encounter: Payer: Self-pay | Admitting: Family Medicine

## 2023-12-01 ENCOUNTER — Telehealth: Admitting: Family Medicine

## 2023-12-01 ENCOUNTER — Ambulatory Visit: Payer: Self-pay

## 2023-12-01 DIAGNOSIS — L237 Allergic contact dermatitis due to plants, except food: Secondary | ICD-10-CM

## 2023-12-01 MED ORDER — DOXYCYCLINE HYCLATE 100 MG PO TABS: 100.0000 mg | ORAL_TABLET | Freq: Two times a day (BID) | ORAL | 0 refills | Status: AC

## 2023-12-01 NOTE — Progress Notes (Signed)
 MyChart Video visit  Subjective: RR:mjdy PCP: Lavell Bari LABOR, FNP Lorraine Mcdaniel is a 58 y.o. female. Patient provides verbal consent for consult held via video.  Due to COVID-19 pandemic this visit was conducted virtually. This visit type was conducted due to national recommendations for restrictions regarding the COVID-19 Pandemic (e.g. social distancing, sheltering in place) in an effort to limit this patient's exposure and mitigate transmission in our community. All issues noted in this document were discussed and addressed.  A physical exam was not performed with this format.   Location of patient: home Location of provider: [facility] Others present for call: none  1. Rash Seen on 11/27/2023 for same.  Notes that she tried utilizing the topical triamcinolone  but it caused her to have more itching so she discontinued.  She has since been using Ivarest/ calamine lotion.  Uses the Atarax  as directed.  She is worried that her lesions may becoming infected because they are redder.  She denies any oozing.  ROS: Per HPI  Allergies  Allergen Reactions   Prednisone  Rash   Chantix [Varenicline Tartrate]     Violent attitude.   Elidel  [Pimecrolimus ]     Cream may cause lip and facial swelling.   Lisinopril      cough   Valacyclovir  Hcl Other (See Comments)    Lips and facial swelling Other reaction(s): Other (See Comments) Lips and facial swelling   Other Palpitations   Oxycodone-Acetaminophen      Other reaction(s): Other (See Comments) GI upset   Percocet [Oxycodone-Acetaminophen ] Other (See Comments)    GI upset   Sulfa Antibiotics Palpitations   Past Medical History:  Diagnosis Date   Anxiety    Hyperlipidemia    Vitamin D  deficiency     Current Outpatient Medications:    acetaminophen  (TYLENOL ) 500 MG tablet, Take 1,000 mg by mouth every 6 (six) hours as needed for moderate pain. (Patient taking differently: Take 1,000 mg by mouth every 6 (six) hours as needed for moderate  pain (pain score 4-6). Taken every day), Disp: , Rfl:    ALPRAZolam  (XANAX ) 0.25 MG tablet, Take 1 tablet (0.25 mg total) by mouth 2 (two) times daily as needed for anxiety., Disp: 20 tablet, Rfl: 1   atorvastatin  (LIPITOR) 20 MG tablet, Take 20 mg by mouth daily., Disp: , Rfl:    cetirizine  (ZYRTEC ) 10 MG tablet, Take 1 tablet (10 mg total) by mouth 2 (two) times daily., Disp: 90 tablet, Rfl: 0   Cholecalciferol 10 MCG (400 UNIT) CAPS, Take by mouth., Disp: , Rfl:    EPINEPHrine  0.3 mg/0.3 mL IJ SOAJ injection, Inject 0.3 mLs (0.3 mg total) into the muscle as needed for anaphylaxis., Disp: 1 each, Rfl: 0   hydrOXYzine  (VISTARIL ) 25 MG capsule, Take 1 capsule (25 mg total) by mouth every 8 (eight) hours as needed., Disp: 30 capsule, Rfl: 0   losartan  (COZAAR ) 100 MG tablet, Take 1 tablet (100 mg total) by mouth daily., Disp: 90 tablet, Rfl: 3   triamcinolone  ointment (KENALOG ) 0.5 %, Apply 1 Application topically 2 (two) times daily., Disp: 30 g, Rfl: 0  Skin: Erythematous maculopapular rash noted along the ankle and foot on the right.  Assessment/ Plan: 58 y.o. female   Plant allergic contact dermatitis - Plan: doxycycline  (VIBRA -TABS) 100 MG tablet  She has had documented allergy /intolerance to both prednisone  and methylprednisone.  I am going to cover for any infectious etiology given increased erythema and irritation noted in the lower extremities.  Discussed taking this with food.  Follow-up as needed  Start time: 12:21pm End time: 12:27pm  Total time spent on patient care (including video visit/ documentation): 10 minutes  Lorraine Lansberry CHRISTELLA Fielding, DO [facility] [phone]

## 2023-12-01 NOTE — Telephone Encounter (Signed)
 FYI Only or Action Required?: Action required by provider: update on patient condition and antibiotic request.  Patient was last seen in primary care on 11/27/2023 by Joesph Annabella HERO, FNP.  Called Nurse Triage reporting Poison Ivy.  Symptoms began several days ago.  Interventions attempted: OTC medications: zyrtec , calamine, ivystop, Prescription medications: trimacinolone, visteril, and Ice/heat application.  Symptoms are: gradually worsening.  Triage Disposition: See Physician Within 24 Hours  Patient/caregiver understands and will follow disposition?: No, wishes to speak with PCP  Copied from CRM [id number]. Topic: Clinical - Medication Question >> Dec 01, 2023 11:08 AM Myrick T wrote: Reason for CRM: patient stated the poison ivy has spread around her ankle and she thinks its now infected. Patient is asking for an antibiotic as her ankle has gotten red Reason for Disposition  MODERATE to SEVERE itching (e.g., interferes with work, school, sleep, or other activities)  Answer Assessment - Initial Assessment Questions 1. APPEARANCE of RASH: What does the rash look like?      Poison ivy -blisters, bumps, redness.  2. LOCATION: Where is the rash located?  (e.g., face, genitals, hands, legs)     Ankle.  3. SIZE: How large is the rash?      Large-no warmth or drainage 4. ONSET: When did the rash begin?      Several days ago 5. ITCHING: Does the rash itch? If Yes, ask: How bad is it?     yes 6. EXPOSURE:  How were you exposed to the plant (poison ivy, poison oak, sumac)  When were you exposed?  Note: Sometimes a poison ivy/oak/sumac rash does not appear for 2 to 3 weeks after exposure.      Yes-already evaluated 7. PAST HISTORY: Have you had a poison ivy rash before? If Yes, ask: How bad was it?     Yes, never this bad 8. OTHER SYMPTOMS: Do you have any other symptoms? (e.g., fever)      Redness   Additional info: 1) Advised office follow up as indicated in  Tiffany's note, she refuses follow up visit we know what this is, I have missed so much work from this, I just want antibiotic called in.   2) Please sent to [facility] [place]  3) Trimacinolone caused increased itching, taking vistaril  but still itching.  Protocols used: Poison Ivy - Oak - Sumac-A-AH

## 2023-12-01 NOTE — Telephone Encounter (Signed)
 I called and spoke with patient and got her scheduled for a video visit today with DOD, Dr Jolinda.

## 2023-12-22 LAB — LAB REPORT - SCANNED
A1c: 5.8
EGFR: 77

## 2024-01-26 DIAGNOSIS — H40051 Ocular hypertension, right eye: Secondary | ICD-10-CM | POA: Diagnosis not present

## 2024-04-05 ENCOUNTER — Encounter: Payer: Self-pay | Admitting: Family

## 2024-04-05 ENCOUNTER — Ambulatory Visit: Payer: Self-pay | Admitting: Family

## 2024-04-05 VITALS — BP 133/77 | HR 97 | Temp 96.8°F | Ht 64.0 in | Wt 185.2 lb

## 2024-04-05 DIAGNOSIS — Z79899 Other long term (current) drug therapy: Secondary | ICD-10-CM | POA: Diagnosis not present

## 2024-04-05 DIAGNOSIS — E669 Obesity, unspecified: Secondary | ICD-10-CM

## 2024-04-05 DIAGNOSIS — F411 Generalized anxiety disorder: Secondary | ICD-10-CM

## 2024-04-05 DIAGNOSIS — K219 Gastro-esophageal reflux disease without esophagitis: Secondary | ICD-10-CM | POA: Diagnosis not present

## 2024-04-05 DIAGNOSIS — E785 Hyperlipidemia, unspecified: Secondary | ICD-10-CM | POA: Diagnosis not present

## 2024-04-05 DIAGNOSIS — E559 Vitamin D deficiency, unspecified: Secondary | ICD-10-CM

## 2024-04-05 DIAGNOSIS — F17200 Nicotine dependence, unspecified, uncomplicated: Secondary | ICD-10-CM

## 2024-04-05 DIAGNOSIS — I10 Essential (primary) hypertension: Secondary | ICD-10-CM

## 2024-04-05 DIAGNOSIS — R7303 Prediabetes: Secondary | ICD-10-CM

## 2024-04-05 NOTE — Patient Instructions (Signed)
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
## 2024-10-04 ENCOUNTER — Ambulatory Visit: Admitting: Family
